# Patient Record
Sex: Female | Born: 1980 | Race: Asian | Hispanic: No | Marital: Married | Smoking: Never smoker
Health system: Southern US, Community
[De-identification: ages and names within clinical notes are randomized; demographics above are authoritative.]

## PROBLEM LIST (undated history)

## (undated) ENCOUNTER — Inpatient Hospital Stay (HOSPITAL_COMMUNITY): Payer: Self-pay

## (undated) DIAGNOSIS — N96 Recurrent pregnancy loss: Secondary | ICD-10-CM

## (undated) DIAGNOSIS — Z8619 Personal history of other infectious and parasitic diseases: Secondary | ICD-10-CM

## (undated) DIAGNOSIS — K759 Inflammatory liver disease, unspecified: Secondary | ICD-10-CM

## (undated) DIAGNOSIS — Z789 Other specified health status: Secondary | ICD-10-CM

## (undated) DIAGNOSIS — O09299 Supervision of pregnancy with other poor reproductive or obstetric history, unspecified trimester: Secondary | ICD-10-CM

## (undated) DIAGNOSIS — B1910 Unspecified viral hepatitis B without hepatic coma: Secondary | ICD-10-CM

## (undated) HISTORY — DX: Recurrent pregnancy loss: N96

## (undated) HISTORY — PX: DILATION AND CURETTAGE OF UTERUS: SHX78

## (undated) HISTORY — DX: Inflammatory liver disease, unspecified: K75.9

## (undated) HISTORY — DX: Personal history of other infectious and parasitic diseases: Z86.19

---

## 2012-03-11 ENCOUNTER — Other Ambulatory Visit: Payer: Self-pay

## 2012-03-12 LAB — OB RESULTS CONSOLE GC/CHLAMYDIA
Chlamydia: NEGATIVE
Gonorrhea: NEGATIVE

## 2012-03-12 LAB — OB RESULTS CONSOLE RUBELLA ANTIBODY, IGM: Rubella: IMMUNE

## 2012-03-12 LAB — OB RESULTS CONSOLE ANTIBODY SCREEN: Antibody Screen: NEGATIVE

## 2012-03-12 LAB — OB RESULTS CONSOLE RPR: RPR: NONREACTIVE

## 2012-04-02 ENCOUNTER — Ambulatory Visit (HOSPITAL_COMMUNITY)
Admission: RE | Admit: 2012-04-02 | Discharge: 2012-04-02 | Disposition: A | Discharge status: Home / Self Care | Payer: BC Managed Care – PPO | Source: Ambulatory Visit | Attending: Obstetrics and Gynecology | Admitting: Obstetrics and Gynecology

## 2012-04-02 DIAGNOSIS — B1910 Unspecified viral hepatitis B without hepatic coma: Secondary | ICD-10-CM | POA: Insufficient documentation

## 2012-04-02 DIAGNOSIS — O98519 Other viral diseases complicating pregnancy, unspecified trimester: Secondary | ICD-10-CM | POA: Insufficient documentation

## 2012-04-02 NOTE — Progress Notes (Signed)
Patient : Jennifer Adams; 31 y.o. MRN# [id number] [facility] Attending: Levi Aland, MD Consult Date: 04/02/2012 -  Consult for: Hepatitis B in pregnancy  HPI: Jennifer Adams is a 31 y.o. with a singleton IUP at 39 6/[redacted] weeks gestational age in pregnancy complicated by notation of Hepatitis B surface Ag positive status.  Subsequent testing demonstrated an chronic state of Hepatitis B infection with viral load in excess of 12,000 copies.  LFTs demonstrate mildly elevated transaminitis.  Hepatitis C titers were negative.  She was seen and evaluated by Hepatology/GI medicine by the [facility] medical group in [place].  She was prescribed Viread 300mg  po daily to reduce her viral load and in theory reduce chances of vertical transmission.  Allergy: Review of patient's allergies indicates not on file. Patient denies food, latex or environmental allergies.   Current Medications: 1. PNV po daily  2. Viread 300mg  po daily (although patient has yet to fill this prescription)  Past Medical History: No past medical history on file. Patient reports that she was first found to have Hepatitis B infection at the age of 31 in [place] prior to enrolling in college.  She never required admission to the hospital or medical treatment for the infection.  Past Surgical History: No past surgical history on file.   Past OB/GYN History: G1.  Elective termination of pregnancy by D&C following diagnosis of lethal aneuploidy via CVS G2.  Current pregnancy.  Social History:   Denies IVDA or recreation drug/alcohol use.   She states the only medication is prenatal vitamin from [facility].    Family History: noncontributory  Review of Systems: No N/V/D, fevers or chills.  Physical Exam: Gen:  Well-appearing and well-nourished Asian female HEENT:  NCAT.  No icterus Integument:  No jaundice Abd:  Gravid Extr:  No edema grossly Neuro:  Grossly intact  Impressions:  Jennifer Adams is a  31 y.o. G2P0010 with IUP at 68 6/[redacted] weeks GA with chronic carrier state of Hepatitis B infection.  Summary of Recommendations: 1.  Viread 300mg  po daily as recommended by her GI medicine specialist is a Class B medication in pregnancy and is in my opinion safe for her to take.  This medication may in theory reduce chance of vertical transmission to the fetus but is not a widespread practice. 2. I recommend HBIG in addition to Hepatitis B vaccination of the newborn within 12 hours of delivery; ie, the pediatricians should be notified of Hepatitis B chronic infection of this patient.  This is the classical method to reduce vertical transmission and administration must be ensured for neonatal benefit. 3. Breast-feeding is safe. 4. The patient should follow up with internal medicine/GI medicine specialist in the postpartum period.  I would recommend referring her to a specialist antepartum/now to establish a rapport and have established follow up of LFTs.  Otherwise, her LFTs should be followed monthly during the pregnancy until delivery.  Acute rise in LFTs should prompt immediate consultation with GI medicine/Hepatology. 5. Of note, there is increased risk of cholestasis of pregnancy with chronic hepatitis.  If the patient has onset of pruritis, I would recommend empiric treatment with Ursodiol 500 mg po bid while awaiting results of serum bile acids.  Of course, if this diagnosis is made, she would require interval growth ultrasounds, twice weekly antenatal testing and delivery by [redacted] weeks gestational age.  At this point, she has no such diagnosis or evidence to suggest drawing bile acids would be of use in  the absence of maternal symptoms. 6. Assuming the patient's status does not change and there remains no indication to time delivery (eg, postdates pregnancy, preeclampsia, etc), spontaneous labor may be allowed to occur and is recommended.  I spent in excess of 40 minutes in review of medical records,  evaluation, and education of your patient in consultation.  More than 50% of this time was spent in direct face-to-face counseling.  It was a pleasure seeing your patient today in consultation.  Thank you for allowing [place] the opportunity to contribute to the care of your patient.  Page with questions.  Merideth Abbey, MD, MS, FACOG Assistant Professor, Maternal-Fetal Medicine

## 2012-04-04 NOTE — Addendum Note (Signed)
Encounter addended by: Ty Hilts, RN on: 04/04/2012 10:22 AM<BR>     Documentation filed: Charges VN

## 2012-04-10 ENCOUNTER — Other Ambulatory Visit: Payer: Self-pay | Admitting: Obstetrics and Gynecology

## 2012-05-15 NOTE — L&D Delivery Note (Signed)
Patient was C/C/+1 and pushed for just over 60 minutes with epidural.   NSVD female infant, Apgars 9/9, weight 8#3.   The patient had one small laceration of the Left vaginal wall at introitus, mucosal, repaired with 3-0 vicryl. Fundus was firm. EBL was expected. Placenta was delivered intact. Vagina was clear.  Baby was vigorous to bedside.  Jennifer Adams

## 2012-05-26 ENCOUNTER — Encounter (HOSPITAL_COMMUNITY): Payer: Self-pay | Admitting: *Deleted

## 2012-05-26 ENCOUNTER — Inpatient Hospital Stay (HOSPITAL_COMMUNITY)
Admission: AD | Admit: 2012-05-26 | Discharge: 2012-05-26 | Disposition: A | Discharge status: Home / Self Care | Payer: BC Managed Care – PPO | Source: Ambulatory Visit | Attending: Obstetrics and Gynecology | Admitting: Obstetrics and Gynecology

## 2012-05-26 DIAGNOSIS — O99891 Other specified diseases and conditions complicating pregnancy: Secondary | ICD-10-CM | POA: Insufficient documentation

## 2012-05-26 DIAGNOSIS — R109 Unspecified abdominal pain: Secondary | ICD-10-CM | POA: Insufficient documentation

## 2012-05-26 HISTORY — DX: Other specified health status: Z78.9

## 2012-05-26 HISTORY — DX: Supervision of pregnancy with other poor reproductive or obstetric history, unspecified trimester: O09.299

## 2012-05-26 NOTE — MAU Note (Signed)
Jennifer Adams is a G1 at [redacted]w[redacted]d presents to MAU with ?ROM. Pt says she noticed some leaking last night that was mucousy and clear. Pt denies pain/contractions, she says she feels a cramp here and there.

## 2012-05-26 NOTE — MAU Note (Signed)
Pt reports she had some leaking last night about 2am. None since. Having mild period like cramping. Good fetal movement reported.

## 2012-05-28 ENCOUNTER — Telehealth (HOSPITAL_COMMUNITY): Payer: Self-pay | Admitting: *Deleted

## 2012-05-28 ENCOUNTER — Encounter (HOSPITAL_COMMUNITY): Payer: Self-pay | Admitting: *Deleted

## 2012-05-28 NOTE — Telephone Encounter (Signed)
Preadmission screen  

## 2012-05-29 ENCOUNTER — Inpatient Hospital Stay (HOSPITAL_COMMUNITY): Payer: BC Managed Care – PPO | Admitting: Anesthesiology

## 2012-05-29 ENCOUNTER — Encounter (HOSPITAL_COMMUNITY): Payer: Self-pay | Admitting: Anesthesiology

## 2012-05-29 ENCOUNTER — Inpatient Hospital Stay (HOSPITAL_COMMUNITY)
Admission: AD | Admit: 2012-05-29 | Discharge: 2012-06-01 | DRG: 373 | Disposition: A | Discharge status: Home / Self Care | Payer: BC Managed Care – PPO | Source: Ambulatory Visit | Attending: Obstetrics and Gynecology | Admitting: Obstetrics and Gynecology

## 2012-05-29 DIAGNOSIS — Z22330 Carrier of Group B streptococcus: Secondary | ICD-10-CM

## 2012-05-29 DIAGNOSIS — B181 Chronic viral hepatitis B without delta-agent: Secondary | ICD-10-CM | POA: Diagnosis present

## 2012-05-29 DIAGNOSIS — O99892 Other specified diseases and conditions complicating childbirth: Secondary | ICD-10-CM | POA: Diagnosis present

## 2012-05-29 DIAGNOSIS — B1910 Unspecified viral hepatitis B without hepatic coma: Secondary | ICD-10-CM

## 2012-05-29 DIAGNOSIS — O26619 Liver and biliary tract disorders in pregnancy, unspecified trimester: Secondary | ICD-10-CM | POA: Diagnosis present

## 2012-05-29 LAB — COMPREHENSIVE METABOLIC PANEL
ALT: 19 U/L (ref 0–35)
AST: 24 U/L (ref 0–37)
Albumin: 3.1 g/dL — ABNORMAL LOW (ref 3.5–5.2)
Alkaline Phosphatase: 198 U/L — ABNORMAL HIGH (ref 39–117)
Calcium: 9.7 mg/dL (ref 8.4–10.5)
Potassium: 4.2 mEq/L (ref 3.5–5.1)
Sodium: 136 mEq/L (ref 135–145)
Total Protein: 6.8 g/dL (ref 6.0–8.3)

## 2012-05-29 LAB — CBC
HCT: 38 % (ref 36.0–46.0)
Hemoglobin: 12.7 g/dL (ref 12.0–15.0)
MCV: 89.6 fL (ref 78.0–100.0)
RDW: 13.6 % (ref 11.5–15.5)
WBC: 17.7 10*3/uL — ABNORMAL HIGH (ref 4.0–10.5)

## 2012-05-29 MED ORDER — ONDANSETRON HCL 4 MG/2ML IJ SOLN: 4.0000 mg | Freq: Four times a day (QID) | INTRAMUSCULAR | Status: DC | PRN

## 2012-05-29 MED ORDER — EPHEDRINE 5 MG/ML INJ: 10.0000 mg | INTRAVENOUS | Status: DC | PRN

## 2012-05-29 MED ORDER — IBUPROFEN 600 MG PO TABS: 600.0000 mg | ORAL_TABLET | Freq: Four times a day (QID) | ORAL | Status: DC | PRN

## 2012-05-29 MED ORDER — PENICILLIN G POTASSIUM 5000000 UNITS IJ SOLR
5.0000 10*6.[IU] | Freq: Once | INTRAVENOUS | Status: AC
  Administered 2012-05-29: 5 10*6.[IU] via INTRAVENOUS
  Filled 2012-05-29: qty 5

## 2012-05-29 MED ORDER — LACTATED RINGERS IV SOLN
500.0000 mL | Freq: Once | INTRAVENOUS | Status: AC
  Administered 2012-05-29: 1000 mL via INTRAVENOUS

## 2012-05-29 MED ORDER — DIPHENHYDRAMINE HCL 50 MG/ML IJ SOLN: 12.5000 mg | INTRAMUSCULAR | Status: DC | PRN

## 2012-05-29 MED ORDER — CITRIC ACID-SODIUM CITRATE 334-500 MG/5ML PO SOLN
30.0000 mL | ORAL | Status: DC | PRN
  Filled 2012-05-29: qty 15

## 2012-05-29 MED ORDER — OXYTOCIN BOLUS FROM INFUSION: 500.0000 mL | INTRAVENOUS | Status: DC

## 2012-05-29 MED ORDER — TERBUTALINE SULFATE 1 MG/ML IJ SOLN: 0.2500 mg | Freq: Once | INTRAMUSCULAR | Status: AC | PRN

## 2012-05-29 MED ORDER — OXYTOCIN 40 UNITS IN LACTATED RINGERS INFUSION - SIMPLE MED: 62.5000 mL/h | INTRAVENOUS | Status: DC

## 2012-05-29 MED ORDER — LIDOCAINE HCL (PF) 1 % IJ SOLN
INTRAMUSCULAR | Status: DC | PRN
  Administered 2012-05-29 (×2): 4 mL

## 2012-05-29 MED ORDER — LACTATED RINGERS IV SOLN: 500.0000 mL | INTRAVENOUS | Status: DC | PRN

## 2012-05-29 MED ORDER — OXYTOCIN 40 UNITS IN LACTATED RINGERS INFUSION - SIMPLE MED
1.0000 m[IU]/min | INTRAVENOUS | Status: DC
  Administered 2012-05-29: 2 m[IU]/min via INTRAVENOUS
  Filled 2012-05-29: qty 1000

## 2012-05-29 MED ORDER — OXYCODONE-ACETAMINOPHEN 5-325 MG PO TABS: 1.0000 | ORAL_TABLET | ORAL | Status: DC | PRN

## 2012-05-29 MED ORDER — LACTATED RINGERS IV SOLN
INTRAVENOUS | Status: DC
  Administered 2012-05-29: 20:00:00 via INTRAVENOUS
  Administered 2012-05-29: 125 mL/h via INTRAVENOUS

## 2012-05-29 MED ORDER — FENTANYL 2.5 MCG/ML BUPIVACAINE 1/10 % EPIDURAL INFUSION (WH - ANES)
INTRAMUSCULAR | Status: DC | PRN
  Administered 2012-05-29: 13 mL/h via EPIDURAL

## 2012-05-29 MED ORDER — FENTANYL 2.5 MCG/ML BUPIVACAINE 1/10 % EPIDURAL INFUSION (WH - ANES)
14.0000 mL/h | INTRAMUSCULAR | Status: DC
  Filled 2012-05-29: qty 125

## 2012-05-29 MED ORDER — EPHEDRINE 5 MG/ML INJ
10.0000 mg | INTRAVENOUS | Status: DC | PRN
  Filled 2012-05-29: qty 4

## 2012-05-29 MED ORDER — ACETAMINOPHEN 325 MG PO TABS: 650.0000 mg | ORAL_TABLET | ORAL | Status: DC | PRN

## 2012-05-29 MED ORDER — LIDOCAINE HCL (PF) 1 % IJ SOLN
30.0000 mL | INTRAMUSCULAR | Status: DC | PRN
  Filled 2012-05-29: qty 30

## 2012-05-29 MED ORDER — PENICILLIN G POTASSIUM 5000000 UNITS IJ SOLR
2.5000 10*6.[IU] | INTRAVENOUS | Status: DC
  Administered 2012-05-29 – 2012-05-30 (×2): 2.5 10*6.[IU] via INTRAVENOUS
  Filled 2012-05-29 (×6): qty 2.5

## 2012-05-29 MED ORDER — FLEET ENEMA 7-19 GM/118ML RE ENEM: 1.0000 | ENEMA | RECTAL | Status: DC | PRN

## 2012-05-29 MED ORDER — PHENYLEPHRINE 40 MCG/ML (10ML) SYRINGE FOR IV PUSH (FOR BLOOD PRESSURE SUPPORT): 80.0000 ug | PREFILLED_SYRINGE | INTRAVENOUS | Status: DC | PRN

## 2012-05-29 MED ORDER — PHENYLEPHRINE 40 MCG/ML (10ML) SYRINGE FOR IV PUSH (FOR BLOOD PRESSURE SUPPORT)
80.0000 ug | PREFILLED_SYRINGE | INTRAVENOUS | Status: DC | PRN
  Filled 2012-05-29: qty 5

## 2012-05-29 NOTE — H&P (Addendum)
31 y.o. [redacted]w[redacted]d  G2P0010 comes in c/o SROM at 1445 .  Otherwise has good fetal movement and no bleeding.  Past Medical History  Diagnosis Date  . No pertinent past medical history   . 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 H/O varicella   . Hepatitis     hepatitis B    Past Surgical History  Procedure Date  . Dilation and curettage of uterus     OB History    Grav Para Term Preterm Abortions TAB SAB Ect Mult Living   2 0 0 0 1 1 0 0 0 0      # Outc Date GA Lbr Len/2nd Wgt Sex Del Anes PTL Lv   1 TAB 7/12 [redacted]w[redacted]d       No   Comments: Termination due to fetal anomaly's, ? Trisomy 18   2 CUR               History   Social History  . Marital Status: Married    Spouse Name: N/A    Number of Children: N/A  . Years of Education: N/A   Occupational History  . Not on file.   Social History Main Topics  . Smoking status: Never Smoker   . Smokeless tobacco: Never Used  . Alcohol Use: No  . Drug Use: No  . Sexually Active: Not Currently   Other Topics Concern  . Not on file   Social History Narrative  . No narrative on file   Review of patient's allergies indicates no known allergies.    Prenatal Transfer Tool  Maternal Diabetes: No Genetic Screening: Normal Maternal Ultrasounds/Referrals: Normal Fetal Ultrasounds or other Referrals:  None Maternal Substance Abuse:  No Significant Maternal Medications:  None Significant Maternal Lab Results: Lab values include: HBsAG positive  Other PNC:    Filed Vitals:   05/29/12 1505  BP: 127/78  Pulse: 89  Temp: 97.8 F (36.6 C)  Resp: 18     Lungs/Cor:  NAD Abdomen:  soft, gravid Ex:  no cords, erythema SVE:  3-4/C/-2 FHTs:  140s, good STV, NST R- class 1 Toco:  q2-5   A/P   SROM at 41 weeks.  Start pitocin if no change in 1 hour.  GBS pos.  Pt is Hep B +- unable to afford meds during pregnancy.   HORVATH,MICHELLE A   Chronic Hep B - declined Viread to reduce vertical transmission due to  expense. ALT/AST 41/33 (04/10/2012) MFM recommends HBIG and Hep B for baby within 12 hrs of delivery

## 2012-05-29 NOTE — Anesthesia Procedure Notes (Signed)
Epidural Patient location during procedure: OB Start time: 05/29/2012 8:01 PM  Staffing Anesthesiologist: Zyairah Wacha A. Performed by: anesthesiologist   Preanesthetic Checklist Completed: patient identified, site marked, surgical consent, pre-op evaluation, timeout performed, IV checked, risks and benefits discussed and monitors and equipment checked  Epidural Patient position: sitting Prep: site prepped and draped and DuraPrep Patient monitoring: continuous pulse ox and blood pressure Approach: midline Injection technique: LOR air  Needle:  Needle type: Tuohy  Needle gauge: 17 G Needle length: 9 cm and 9 Needle insertion depth: 4 cm Catheter type: closed end flexible Catheter size: 19 Gauge Catheter at skin depth: 9 cm Test dose: negative and Other  Assessment Events: blood not aspirated, injection not painful, no injection resistance, negative IV test and no paresthesia  Additional Notes Patient identified. Risks and benefits discussed including failed block, incomplete  Pain control, post dural puncture headache, nerve damage, paralysis, blood pressure Changes, nausea, vomiting, reactions to medications-both toxic and allergic and post Partum back pain. All questions were answered. Patient expressed understanding and wished to proceed. Sterile technique was used throughout procedure. Epidural site was Dressed with sterile barrier dressing. No paresthesias, signs of intravascular injection Or signs of intrathecal spread were encountered.  Patient was more comfortable after the epidural was dosed. Please see RN's note for documentation of vital signs and FHR which are stable.

## 2012-05-29 NOTE — MAU Note (Signed)
41 wks, SROM in office, was to be direct admit, L&D not aware will call Dr. Dareen Piano for orders

## 2012-05-29 NOTE — MAU Note (Signed)
Dr Dareen Piano called for orders, he had just completed call to L&D, pt taken to Room 166 via W/C

## 2012-05-29 NOTE — Anesthesia Preprocedure Evaluation (Signed)
Anesthesia Evaluation  Patient identified by MRN, date of birth, ID band Patient awake    Reviewed: Allergy & Precautions, H&P , Patient's Chart, lab work & pertinent test results  Airway Mallampati: III TM Distance: >3 FB Neck ROM: full    Dental No notable dental hx. (+) Teeth Intact   Pulmonary neg pulmonary ROS,  breath sounds clear to auscultation  Pulmonary exam normal       Cardiovascular negative cardio ROS  Rhythm:regular Rate:Normal     Neuro/Psych negative neurological ROS  negative psych ROS   GI/Hepatic negative GI ROS, (+) Hepatitis -, B  Endo/Other  negative endocrine ROS  Renal/GU negative Renal ROS  negative genitourinary   Musculoskeletal   Abdominal Normal abdominal exam  (+)   Peds  Hematology negative hematology ROS (+)   Anesthesia Other Findings   Reproductive/Obstetrics (+) Pregnancy                           Anesthesia Physical Anesthesia Plan  ASA: II  Anesthesia Plan: Epidural   Post-op Pain Management:    Induction:   Airway Management Planned:   Additional Equipment:   Intra-op Plan:   Post-operative Plan:   Informed Consent: I have reviewed the patients History and Physical, chart, labs and discussed the procedure including the risks, benefits and alternatives for the proposed anesthesia with the patient or authorized representative who has indicated his/her understanding and acceptance.     Plan Discussed with: Anesthesiologist  Anesthesia Plan Comments:         Anesthesia Quick Evaluation

## 2012-05-30 ENCOUNTER — Inpatient Hospital Stay (HOSPITAL_COMMUNITY): Admission: RE | Admit: 2012-05-30 | Payer: BC Managed Care – PPO | Source: Ambulatory Visit

## 2012-05-30 ENCOUNTER — Encounter (HOSPITAL_COMMUNITY): Payer: Self-pay | Admitting: *Deleted

## 2012-05-30 LAB — CBC
MCHC: 33.8 g/dL (ref 30.0–36.0)
RDW: 13.6 % (ref 11.5–15.5)
WBC: 21.2 10*3/uL — ABNORMAL HIGH (ref 4.0–10.5)

## 2012-05-30 MED ORDER — DIBUCAINE 1 % RE OINT: 1.0000 "application " | TOPICAL_OINTMENT | RECTAL | Status: DC | PRN

## 2012-05-30 MED ORDER — WITCH HAZEL-GLYCERIN EX PADS: 1.0000 "application " | MEDICATED_PAD | CUTANEOUS | Status: DC | PRN

## 2012-05-30 MED ORDER — PRENATAL MULTIVITAMIN CH
1.0000 | ORAL_TABLET | Freq: Every day | ORAL | Status: DC
  Administered 2012-05-30 – 2012-06-01 (×3): 1 via ORAL
  Filled 2012-05-30 (×3): qty 1

## 2012-05-30 MED ORDER — TETANUS-DIPHTH-ACELL PERTUSSIS 5-2.5-18.5 LF-MCG/0.5 IM SUSP: 0.5000 mL | Freq: Once | INTRAMUSCULAR | Status: DC

## 2012-05-30 MED ORDER — SENNOSIDES-DOCUSATE SODIUM 8.6-50 MG PO TABS
2.0000 | ORAL_TABLET | Freq: Every day | ORAL | Status: DC
  Administered 2012-05-30 – 2012-05-31 (×2): 2 via ORAL

## 2012-05-30 MED ORDER — ONDANSETRON HCL 4 MG/2ML IJ SOLN: 4.0000 mg | INTRAMUSCULAR | Status: DC | PRN

## 2012-05-30 MED ORDER — SIMETHICONE 80 MG PO CHEW: 80.0000 mg | CHEWABLE_TABLET | ORAL | Status: DC | PRN

## 2012-05-30 MED ORDER — DIPHENHYDRAMINE HCL 25 MG PO CAPS: 25.0000 mg | ORAL_CAPSULE | Freq: Four times a day (QID) | ORAL | Status: DC | PRN

## 2012-05-30 MED ORDER — ZOLPIDEM TARTRATE 5 MG PO TABS: 5.0000 mg | ORAL_TABLET | Freq: Every evening | ORAL | Status: DC | PRN

## 2012-05-30 MED ORDER — LANOLIN HYDROUS EX OINT: TOPICAL_OINTMENT | CUTANEOUS | Status: DC | PRN

## 2012-05-30 MED ORDER — ONDANSETRON HCL 4 MG PO TABS: 4.0000 mg | ORAL_TABLET | ORAL | Status: DC | PRN

## 2012-05-30 MED ORDER — OXYCODONE-ACETAMINOPHEN 5-325 MG PO TABS
1.0000 | ORAL_TABLET | ORAL | Status: DC | PRN
  Administered 2012-05-30 – 2012-05-31 (×2): 1 via ORAL
  Filled 2012-05-30 (×2): qty 1

## 2012-05-30 MED ORDER — IBUPROFEN 600 MG PO TABS
600.0000 mg | ORAL_TABLET | Freq: Four times a day (QID) | ORAL | Status: DC
  Administered 2012-05-30 – 2012-06-01 (×10): 600 mg via ORAL
  Filled 2012-05-30 (×10): qty 1

## 2012-05-30 MED ORDER — BENZOCAINE-MENTHOL 20-0.5 % EX AERO: 1.0000 "application " | INHALATION_SPRAY | CUTANEOUS | Status: DC | PRN

## 2012-05-30 NOTE — Anesthesia Postprocedure Evaluation (Signed)
  Anesthesia Post-op Note  Patient: Jennifer Adams  Procedure(s) Performed: * No procedures listed *  Patient Location: Mother/Baby  Anesthesia Type:Epidural  Level of Consciousness: awake  Airway and Oxygen Therapy: Patient Spontanous Breathing  Post-op Pain: none  Post-op Assessment: Patient's Cardiovascular Status Stable, Respiratory Function Stable, Patent Airway, No signs of Nausea or vomiting, Adequate PO intake, Pain level controlled, No headache, No backache, No residual numbness and No residual motor weakness  Post-op Vital Signs: Reviewed and stable  Complications: No apparent anesthesia complications

## 2012-05-30 NOTE — Progress Notes (Signed)
I spoke with nursery personally to confirm awareness of mother's chronic Hep B status and need for Hep B vaccine as well as HBIG within 12 hrs of delivery.

## 2012-05-30 NOTE — Progress Notes (Signed)
Patient is eating, ambulating, voiding.  Pain control is good.  Appropriate lochia.  No complaints.  Filed Vitals:   05/30/12 0316 05/30/12 0331 05/30/12 0415 05/30/12 0530  BP: 121/67 122/63 125/80 113/74  Pulse: 78 75 76 80  Temp:   98 F (36.7 C) 98.2 F (36.8 C)  TempSrc:   Oral Oral  Resp:   16 16  Height:      Weight:      SpO2:   98% 96%    Fundus firm Perineum without swelling. No CT  Lab Results  Component Value Date   WBC 17.7* 05/29/2012   HGB 12.7 05/29/2012   HCT 38.0 05/29/2012   MCV 89.6 05/29/2012   PLT 272 05/29/2012    --/--/O POS, O POS (01/15 1600)  A/P Post partum day 1. No circ. Baby received HBIG and Hep B vaccine.  Routine care.  Expect d/c 1/17.    Philip Aspen

## 2012-05-31 NOTE — Progress Notes (Signed)
PPD#1 Pt without complaints. Does not want to go home today. Baby is doing well. VSSAF IMP/ Doing well. PLAN/ will discharge in am.

## 2012-06-01 MED ORDER — OXYCODONE-ACETAMINOPHEN 5-325 MG PO TABS: 1.0000 | ORAL_TABLET | ORAL | Status: DC | PRN

## 2012-06-01 NOTE — Discharge Summary (Signed)
Obstetric Discharge Summary Reason for Admission: rupture of membranes Prenatal Procedures: ultrasound Intrapartum Procedures: spontaneous vaginal delivery Postpartum Procedures: none Complications-Operative and Postpartum: chronic hepatitis B Hemoglobin  Date Value Range Status  05/30/2012 11.2* 12.0 - 15.0 g/dL Final     HCT  Date Value Range Status  05/30/2012 33.1* 36.0 - 46.0 % Final    Physical Exam:  General: alert Lochia: appropriate Uterine Fundus: firm Discharge Diagnoses: Term Pregnancy-delivered and Chronic Hep B  Discharge Information: Date: 06/01/2012 Activity: pelvic rest Diet: routine Medications: PNV, Ibuprofen and Percocet Condition: stable Instructions: refer to practice specific booklet Discharge to: home Follow-up Information    Follow up with CALLAHAN, SIDNEY, DO. Schedule an appointment as soon as possible for a visit in 4 weeks.   Contact information:   [address] [place] [address] [phone]          Newborn Data: Live born female  Birth Weight: 8 lb 3.2 oz (3719 g) APGAR: 9, 9  Home with mother.  ANDERSON,MARK E 06/01/2012, 10:31 AM

## 2012-06-01 NOTE — Progress Notes (Signed)
PPD#2 Pt without complaints. VSSAF IMP/ ready for discharge PLAN/ discharge.

## 2012-06-02 LAB — TYPE AND SCREEN
ABO/RH(D): O POS
Antibody Screen: NEGATIVE
Unit division: 0
Unit division: 0

## 2012-06-29 ENCOUNTER — Other Ambulatory Visit: Payer: Self-pay

## 2013-03-20 ENCOUNTER — Other Ambulatory Visit: Payer: Self-pay

## 2013-10-31 ENCOUNTER — Encounter (HOSPITAL_COMMUNITY): Payer: Self-pay | Admitting: General Practice

## 2013-10-31 ENCOUNTER — Inpatient Hospital Stay (HOSPITAL_COMMUNITY)
Admission: AD | Admit: 2013-10-31 | Discharge: 2013-10-31 | Disposition: A | Discharge status: Home / Self Care | Payer: Managed Care, Other (non HMO) | Source: Ambulatory Visit | Attending: Obstetrics and Gynecology | Admitting: Obstetrics and Gynecology

## 2013-10-31 ENCOUNTER — Inpatient Hospital Stay (HOSPITAL_COMMUNITY): Payer: Managed Care, Other (non HMO)

## 2013-10-31 DIAGNOSIS — O200 Threatened abortion: Secondary | ICD-10-CM | POA: Insufficient documentation

## 2013-10-31 HISTORY — DX: Unspecified viral hepatitis B without hepatic coma: B19.10

## 2013-10-31 LAB — URINALYSIS, ROUTINE W REFLEX MICROSCOPIC
BILIRUBIN URINE: NEGATIVE
Glucose, UA: NEGATIVE mg/dL
Ketones, ur: NEGATIVE mg/dL
Leukocytes, UA: NEGATIVE
Nitrite: NEGATIVE
PROTEIN: NEGATIVE mg/dL
Specific Gravity, Urine: <1.005 — ABNORMAL LOW (ref 1.005–1.030)
UROBILINOGEN UA: 0.2 mg/dL (ref 0.0–1.0)
pH: 6 (ref 5.0–8.0)

## 2013-10-31 LAB — CBC
HEMATOCRIT: 38.6 % (ref 36.0–46.0)
Hemoglobin: 13.4 g/dL (ref 12.0–15.0)
MCH: 29.8 pg (ref 26.0–34.0)
MCHC: 34.7 g/dL (ref 30.0–36.0)
MCV: 86 fL (ref 78.0–100.0)
Platelets: 323 10*3/uL (ref 150–400)
RBC: 4.49 MIL/uL (ref 3.87–5.11)
RDW: 11.9 % (ref 11.5–15.5)
WBC: 5.8 10*3/uL (ref 4.0–10.5)

## 2013-10-31 LAB — URINE MICROSCOPIC-ADD ON

## 2013-10-31 LAB — POCT PREGNANCY, URINE: Preg Test, Ur: POSITIVE — AB

## 2013-10-31 LAB — WET PREP, GENITAL
Clue Cells Wet Prep HPF POC: NONE SEEN
Trich, Wet Prep: NONE SEEN
Yeast Wet Prep HPF POC: NONE SEEN

## 2013-10-31 LAB — HCG, QUANTITATIVE, PREGNANCY: hCG, Beta Chain, Quant, S: 141 m[IU]/mL — ABNORMAL HIGH (ref <5)

## 2013-10-31 NOTE — MAU Note (Signed)
Patient states she had a positive home pregnancy test about 2 weeks ago. States she had light spotting yesterday, more today. Denies pain, nausea or vomiting.

## 2013-10-31 NOTE — Discharge Instructions (Signed)

## 2013-10-31 NOTE — MAU Provider Note (Signed)
Chief Complaint: Possible Pregnancy and Vaginal Bleeding   First Malaquias Lenker Initiated Contact with Patient 10/31/13 1148     SUBJECTIVE HPI: Jennifer Adams is a 33 y.o. G3P1011 at 2818w6d by LMP who presents to maternity admissions reporting positive pregnancy test at home and onset of spotting yesterday with increase to bright red bleeding today.  She reports 1/4 of each pad soaked when changed 2-3 times today.  She denies vaginal itching/burning, urinary symptoms, h/a, dizziness, n/v, or fever/chills.     Past Medical History  Diagnosis Date  . No pertinent past medical history   . 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 H/O varicella   . Hepatitis     hepatitis B  . Hepatitis B    Past Surgical History  Procedure Laterality Date  . Dilation and curettage of uterus     History   Social History  . Marital Status: Married    Spouse Name: N/A    Number of Children: N/A  . Years of Education: N/A   Occupational History  . Not on file.   Social History Main Topics  . Smoking status: Never Smoker   . Smokeless tobacco: Never Used  . Alcohol Use: No  . Drug Use: No  . Sexual Activity: Not Currently   Other Topics Concern  . Not on file   Social History Narrative  . No narrative on file   No current facility-administered medications on file prior to encounter.   No current outpatient prescriptions on file prior to encounter.   No Known Allergies  ROS: Pertinent items in HPI  OBJECTIVE Blood pressure 125/76, pulse 76, temperature 99.2 F (37.3 C), temperature source Oral, resp. rate 16, height 5\' 3"  (1.6 m), weight 66.588 kg (146 lb 12.8 oz), last menstrual period 09/13/2013, unknown if currently breastfeeding. GENERAL: Well-developed, well-nourished female in no acute distress.  HEENT: Normocephalic HEART: normal rate RESP: normal effort ABDOMEN: Soft, non-tender EXTREMITIES: Nontender, no edema NEURO: Alert and oriented Pelvic exam: Cervix pink,  visually closed, without lesion, large amount of dark red blood without clots, vaginal walls and external genitalia normal Bimanual exam: Cervix 0/long/high, firm, anterior, neg CMT, uterus nontender, nonenlarged, adnexa without tenderness, enlargement, or mass  LAB RESULTS Results for orders placed during the hospital encounter of 10/31/13 (from the past 24 hour(s))  URINALYSIS, ROUTINE W REFLEX MICROSCOPIC     Status: Abnormal   Collection Time    10/31/13 11:01 AM      Result Value Ref Range   Color, Urine YELLOW  YELLOW   APPearance HAZY (*) CLEAR   Specific Gravity, Urine <1.005 (*) 1.005 - 1.030   pH 6.0  5.0 - 8.0   Glucose, UA NEGATIVE  NEGATIVE mg/dL   Hgb urine dipstick LARGE (*) NEGATIVE   Bilirubin Urine NEGATIVE  NEGATIVE   Ketones, ur NEGATIVE  NEGATIVE mg/dL   Protein, ur NEGATIVE  NEGATIVE mg/dL   Urobilinogen, UA 0.2  0.0 - 1.0 mg/dL   Nitrite NEGATIVE  NEGATIVE   Leukocytes, UA NEGATIVE  NEGATIVE  URINE MICROSCOPIC-ADD ON     Status: Abnormal   Collection Time    10/31/13 11:01 AM      Result Value Ref Range   Squamous Epithelial / LPF FEW (*) RARE   RBC / HPF 0-2  <3 RBC/hpf   Bacteria, UA FEW (*) RARE  POCT PREGNANCY, URINE     Status: Abnormal   Collection Time    10/31/13 11:06 AM  Result Value Ref Range   Preg Test, Ur POSITIVE (*) NEGATIVE  WET PREP, GENITAL     Status: Abnormal   Collection Time    10/31/13 11:57 AM      Result Value Ref Range   Yeast Wet Prep HPF POC NONE SEEN  NONE SEEN   Trich, Wet Prep NONE SEEN  NONE SEEN   Clue Cells Wet Prep HPF POC NONE SEEN  NONE SEEN   WBC, Wet Prep HPF POC MODERATE (*) NONE SEEN  CBC     Status: None   Collection Time    10/31/13 12:00 PM      Result Value Ref Range   WBC 5.8  4.0 - 10.5 K/uL   RBC 4.49  3.87 - 5.11 MIL/uL   Hemoglobin 13.4  12.0 - 15.0 g/dL   HCT 16.1  09.6 - 04.5 %   MCV 86.0  78.0 - 100.0 fL   MCH 29.8  26.0 - 34.0 pg   MCHC 34.7  30.0 - 36.0 g/dL   RDW 40.9  81.1 - 91.4 %    Platelets 323  150 - 400 K/uL  HCG, QUANTITATIVE, PREGNANCY     Status: Abnormal   Collection Time    10/31/13 12:00 PM      Result Value Ref Range   hCG, Beta Chain, Quant, S 141 (*) <5 mIU/mL    IMAGING US Ob Comp Less 14 Wks  10/31/2013   CLINICAL DATA:  Vaginal bleeding.  First trimester pregnancy.  EXAM: OBSTETRIC <14 WK [place] AND TRANSVAGINAL OB US  TECHNIQUE: Both transabdominal and transvaginal ultrasound examinations were performed for complete evaluation of the gestation as well as the maternal uterus, adnexal regions, and pelvic cul-de-sac. Transvaginal technique was performed to assess early pregnancy.  COMPARISON:  None.  FINDINGS: Intrauterine gestational sac: Visualized but small.  Yolk sac:  Not visualized  Embryo:  Not visualized  Cardiac Activity: Not visualized  MSD:  3  mm   4 w   5  d  [place] EDC: 07/05/2014  Maternal uterus/adnexae: 2 cm cyst, right ovary. Adnexa otherwise unremarkable. No free pelvic fluid.  IMPRESSION: 1. 3 mm fluid collection favoring gestational sac in the endometrial cavity. Probable early intrauterine gestational sac, but no yolk sac, fetal pole, or cardiac activity yet visualized. Recommend follow-up quantitative B-HCG levels and follow-up US in 14 days to confirm and assess viability. This recommendation follows SRU consensus guidelines: Diagnostic Criteria for Nonviable Pregnancy Early in the First Trimester. Malva Limes Med 2013; 782:9562-13.   Electronically Signed   By: Herbie Baltimore M.D.   On: 10/31/2013 13:00   US Ob Transvaginal  10/31/2013   CLINICAL DATA:  Vaginal bleeding.  First trimester pregnancy.  EXAM: OBSTETRIC <14 WK [place] AND TRANSVAGINAL OB US  TECHNIQUE: Both transabdominal and transvaginal ultrasound examinations were performed for complete evaluation of the gestation as well as the maternal uterus, adnexal regions, and pelvic cul-de-sac. Transvaginal technique was performed to assess early pregnancy.  COMPARISON:  None.  FINDINGS:  Intrauterine gestational sac: Visualized but small.  Yolk sac:  Not visualized  Embryo:  Not visualized  Cardiac Activity: Not visualized  MSD:  3  mm   4 w   5  d  [place] EDC: 07/05/2014  Maternal uterus/adnexae: 2 cm cyst, right ovary. Adnexa otherwise unremarkable. No free pelvic fluid.  IMPRESSION: 1. 3 mm fluid collection favoring gestational sac in the endometrial cavity. Probable early intrauterine gestational sac, but no yolk sac, fetal  pole, or cardiac activity yet visualized. Recommend follow-up quantitative B-HCG levels and follow-up US in 14 days to confirm and assess viability. This recommendation follows SRU consensus guidelines: Diagnostic Criteria for Nonviable Pregnancy Early in the First Trimester. Malva Limes Engl J Med 2013; 161:0960-45; 369:1443-51.   Electronically Signed   By: Herbie BaltimoreWalt  Liebkemann M.D.   On: 10/31/2013 13:00    ASSESSMENT 1. Threatened miscarriage in early pregnancy     PLAN Consult Dr Claiborne Billingsallahan Discharge home Return to MAU in 48 hours for repeat quant hcg Return sooner as needed for emergencies    Medication List         acetaminophen 325 MG tablet  Commonly known as:  TYLENOL  Take 650 mg by mouth every 6 (six) hours as needed for mild pain or headache.     loratadine 10 MG tablet  Commonly known as:  CLARITIN  Take 10 mg by mouth daily as needed for allergies.     prenatal multivitamin Tabs tablet  Take 1 tablet by mouth daily at 12 noon.           Follow-up Information   [facility] days. (Return to MAU on Sunday 6/21 after noon for repeat labs.  Return sooner as needed for emergencies. )    Contact information:   [address] [id number] [place] [id number] [phone]      Sharen CounterLisa Leftwich-Kirby Certified Nurse-Midwife 10/31/2013  1:31 PM

## 2013-11-01 LAB — GC/CHLAMYDIA PROBE AMP
CT PROBE, AMP APTIMA: NEGATIVE
GC PROBE AMP APTIMA: NEGATIVE

## 2013-11-02 ENCOUNTER — Inpatient Hospital Stay (HOSPITAL_COMMUNITY)
Admission: AD | Admit: 2013-11-02 | Discharge: 2013-11-02 | Disposition: A | Discharge status: Home / Self Care | Payer: Managed Care, Other (non HMO) | Source: Ambulatory Visit | Attending: Obstetrics and Gynecology | Admitting: Obstetrics and Gynecology

## 2013-11-02 DIAGNOSIS — O039 Complete or unspecified spontaneous abortion without complication: Secondary | ICD-10-CM

## 2013-11-02 LAB — HCG, QUANTITATIVE, PREGNANCY: hCG, Beta Chain, Quant, S: 20 m[IU]/mL — ABNORMAL HIGH (ref <5)

## 2013-11-02 NOTE — Discharge Instructions (Signed)
Miscarriage A miscarriage is the sudden loss of an unborn baby (fetus) before the 20th week of pregnancy. Most miscarriages happen in the first 3 months of pregnancy. Sometimes, it happens before a woman even knows she is pregnant. A miscarriage is also called a "spontaneous miscarriage" or "early pregnancy loss." Having a miscarriage can be an emotional experience. Talk with your caregiver about any questions you may have about miscarrying, the grieving process, and your future pregnancy plans. CAUSES   Problems with the fetal chromosomes that make it impossible for the baby to develop normally. Problems with the baby's genes or chromosomes are most often the result of errors that occur, by chance, as the embryo divides and grows. The problems are not inherited from the parents.  Infection of the cervix or uterus.   Hormone problems.   Problems with the cervix, such as having an incompetent cervix. This is when the tissue in the cervix is not strong enough to hold the pregnancy.   Problems with the uterus, such as an abnormally shaped uterus, uterine fibroids, or congenital abnormalities.   Certain medical conditions.   Smoking, drinking alcohol, or taking illegal drugs.   Trauma.  Often, the cause of a miscarriage is unknown.  SYMPTOMS   Vaginal bleeding or spotting, with or without cramps or pain.  Pain or cramping in the abdomen or lower back.  Passing fluid, tissue, or blood clots from the vagina. DIAGNOSIS  Your caregiver will perform a physical exam. You may also have an ultrasound to confirm the miscarriage. Blood or urine tests may also be ordered. TREATMENT   Sometimes, treatment is not necessary if you naturally pass all the fetal tissue that was in the uterus. If some of the fetus or placenta remains in the body (incomplete miscarriage), tissue left behind may become infected and must be removed. Usually, a dilation and curettage (D and C) procedure is performed.  During a D and C procedure, the cervix is widened (dilated) and any remaining fetal or placental tissue is gently removed from the uterus.  Antibiotic medicines are prescribed if there is an infection. Other medicines may be given to reduce the size of the uterus (contract) if there is a lot of bleeding.  If you have Rh negative blood and your baby was Rh positive, you will need a Rh immunoglobulin shot. This shot will protect any future baby from having Rh blood problems in future pregnancies. HOME CARE INSTRUCTIONS   Your caregiver may order bed rest or may allow you to continue light activity. Resume activity as directed by your caregiver.  Have someone help with home and family responsibilities during this time.   Keep track of the number of sanitary pads you use each day and how soaked (saturated) they are. Write down this information.   Do not use tampons. Do not douche or have sexual intercourse until approved by your caregiver.   Only take over-the-counter or prescription medicines for pain or discomfort as directed by your caregiver.   Do not take aspirin. Aspirin can cause bleeding.   Keep all follow-up appointments with your caregiver.   If you or your partner have problems with grieving, talk to your caregiver or seek counseling to help cope with the pregnancy loss. Allow enough time to grieve before trying to get pregnant again.  SEEK IMMEDIATE MEDICAL CARE IF:   You have severe cramps or pain in your back or abdomen.  You have a fever.  You pass large blood clots (walnut-sized   or larger) ortissue from your vagina. Save any tissue for your caregiver to inspect.   Your bleeding increases.   You have a thick, bad-smelling vaginal discharge.  You become lightheaded, weak, or you faint.   You have chills.  MAKE SURE YOU:  Understand these instructions.  Will watch your condition.  Will get help right away if you are not doing well or get  worse. Document Released: 10/25/2000 Document Revised: 08/26/2012 Document Reviewed: 06/20/2011 ExitCare Patient Information 2015 [employer]. This information is not intended to replace advice given to you by your health care provider. Make sure you discuss any questions you have with your health care provider.  

## 2013-11-02 NOTE — MAU Provider Note (Signed)
  History     CSN: [id number]  Arrival date and time: 11/02/13 1234   None     Chief Complaint  Patient presents with  . Labs Only   HPI  Jennifer Adams is a 33 y.o. G3P1011 at 7237w1d who presents today for FU HCG. She denies any pain at this time, and has had some scant bleeding.   Past Medical History  Diagnosis Date  . No pertinent past medical history   . 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 H/O varicella   . Hepatitis     hepatitis B  . Hepatitis B     Past Surgical History  Procedure Laterality Date  . Dilation and curettage of uterus      Family History  Problem Relation Age of Onset  . Hypertension Father   . Cancer Maternal Uncle     History  Substance Use Topics  . Smoking status: Never Smoker   . Smokeless tobacco: Never Used  . Alcohol Use: No    Allergies: No Known Allergies  Prescriptions prior to admission  Medication Sig Dispense Refill  . acetaminophen (TYLENOL) 325 MG tablet Take 650 mg by mouth every 6 (six) hours as needed for mild pain or headache.      . loratadine (CLARITIN) 10 MG tablet Take 10 mg by mouth daily as needed for allergies.      . Prenatal Vit-Fe Fumarate-FA (PRENATAL MULTIVITAMIN) TABS tablet Take 1 tablet by mouth daily at 12 noon.        ROS Physical Exam   Blood pressure 122/66, pulse 65, resp. rate 18, last menstrual period 09/13/2013, unknown if currently breastfeeding.  Physical Exam  Nursing note and vitals reviewed. Constitutional: She is oriented to person, place, and time. She appears well-developed and well-nourished. No distress.  Cardiovascular: Normal rate.   Respiratory: Effort normal.  GI: Soft. There is no tenderness.  Neurological: She is alert and oriented to person, place, and time.  Skin: Skin is warm and dry.  Psychiatric: She has a normal mood and affect.    MAU Course  Procedures  Results for Jennifer Adams, Jennifer Adams (MRN [id number]) as of 11/02/2013 13:19  Ref. Range 10/31/2013 12:00  10/31/2013 12:52 11/02/2013 12:43  hCG, Beta Chain, Quant, S Latest Range: <5 mIU/mL 141 (H)  20 (H)   Assessment and Plan   1. SAB (spontaneous abortion)    Bleeding precautions Return to MAU as needed FU with the office as planned   Tawnya CrookHogan, Heather Donovan 11/02/2013, 1:19 PM

## 2013-11-02 NOTE — MAU Note (Signed)
Pt presents to MAU for repeat BHCG. Denies any pain, reports scant amount of vaginal bleeding 

## 2014-03-06 LAB — OB RESULTS CONSOLE GC/CHLAMYDIA
Chlamydia: NEGATIVE
GC PROBE AMP, GENITAL: NEGATIVE

## 2014-03-16 ENCOUNTER — Encounter (HOSPITAL_COMMUNITY): Payer: Self-pay | Admitting: General Practice

## 2014-03-31 LAB — OB RESULTS CONSOLE ABO/RH: RH Type: POSITIVE

## 2014-03-31 LAB — OB RESULTS CONSOLE HIV ANTIBODY (ROUTINE TESTING): HIV: NONREACTIVE

## 2014-03-31 LAB — OB RESULTS CONSOLE RPR: RPR: NONREACTIVE

## 2014-03-31 LAB — OB RESULTS CONSOLE RUBELLA ANTIBODY, IGM: RUBELLA: IMMUNE

## 2014-03-31 LAB — OB RESULTS CONSOLE ANTIBODY SCREEN: ANTIBODY SCREEN: NEGATIVE

## 2014-05-15 NOTE — L&D Delivery Note (Signed)
Delivery Note At 1:17 AM a viable and healthy female was delivered via Vaginal, Spontaneous Delivery (Presentation: Right ; Occiput Anterior).  APGAR: 9, 9; weight  pending.   Placenta status: Intact, Spontaneous.  Cord: 3 vessels with the following complications: None.    Anesthesia: Epidural  Episiotomy: None Lacerations: None Suture Repair: None Est. Blood Loss (mL):  300cc estimated  Mom to postpartum.  Baby to Couplet care / Skin to Skin.  Gayleen Sholtz H. 10/22/2014, 1:40 AM

## 2014-08-28 LAB — OB RESULTS CONSOLE HEPATITIS B SURFACE ANTIGEN: Hepatitis B Surface Ag: POSITIVE

## 2014-09-05 ENCOUNTER — Encounter (HOSPITAL_COMMUNITY): Payer: Self-pay | Admitting: *Deleted

## 2014-09-18 LAB — OB RESULTS CONSOLE GBS: STREP GROUP B AG: NEGATIVE

## 2014-10-21 ENCOUNTER — Encounter (HOSPITAL_COMMUNITY): Payer: Self-pay | Admitting: *Deleted

## 2014-10-21 ENCOUNTER — Inpatient Hospital Stay (HOSPITAL_COMMUNITY): Payer: Managed Care, Other (non HMO) | Admitting: Anesthesiology

## 2014-10-21 ENCOUNTER — Inpatient Hospital Stay (HOSPITAL_COMMUNITY)
Admission: AD | Admit: 2014-10-21 | Discharge: 2014-10-23 | DRG: 774 | Disposition: A | Discharge status: Home / Self Care | Payer: Managed Care, Other (non HMO) | Source: Ambulatory Visit | Attending: Obstetrics and Gynecology | Admitting: Obstetrics and Gynecology

## 2014-10-21 DIAGNOSIS — O9842 Viral hepatitis complicating childbirth: Secondary | ICD-10-CM | POA: Diagnosis present

## 2014-10-21 DIAGNOSIS — O4292 Full-term premature rupture of membranes, unspecified as to length of time between rupture and onset of labor: Principal | ICD-10-CM | POA: Diagnosis present

## 2014-10-21 DIAGNOSIS — B181 Chronic viral hepatitis B without delta-agent: Secondary | ICD-10-CM | POA: Diagnosis present

## 2014-10-21 DIAGNOSIS — O480 Post-term pregnancy: Secondary | ICD-10-CM | POA: Diagnosis present

## 2014-10-21 DIAGNOSIS — Z3A40 40 weeks gestation of pregnancy: Secondary | ICD-10-CM | POA: Diagnosis present

## 2014-10-21 DIAGNOSIS — IMO0001 Reserved for inherently not codable concepts without codable children: Secondary | ICD-10-CM

## 2014-10-21 LAB — CBC
HCT: 34.4 % — ABNORMAL LOW (ref 36.0–46.0)
HEMOGLOBIN: 11.9 g/dL — AB (ref 12.0–15.0)
MCH: 30.4 pg (ref 26.0–34.0)
MCHC: 34.6 g/dL (ref 30.0–36.0)
MCV: 88 fL (ref 78.0–100.0)
PLATELETS: 195 10*3/uL (ref 150–400)
RBC: 3.91 MIL/uL (ref 3.87–5.11)
RDW: 12.9 % (ref 11.5–15.5)
WBC: 16.9 10*3/uL — ABNORMAL HIGH (ref 4.0–10.5)

## 2014-10-21 LAB — TYPE AND SCREEN
ABO/RH(D): O POS
ANTIBODY SCREEN: NEGATIVE

## 2014-10-21 MED ORDER — OXYCODONE-ACETAMINOPHEN 5-325 MG PO TABS: 1.0000 | ORAL_TABLET | ORAL | Status: DC | PRN

## 2014-10-21 MED ORDER — LIDOCAINE HCL (PF) 1 % IJ SOLN
INTRAMUSCULAR | Status: DC | PRN
  Administered 2014-10-21: 4 mL
  Administered 2014-10-21: 3 mL

## 2014-10-21 MED ORDER — OXYTOCIN 40 UNITS IN LACTATED RINGERS INFUSION - SIMPLE MED
62.5000 mL/h | INTRAVENOUS | Status: DC
  Filled 2014-10-21: qty 1000

## 2014-10-21 MED ORDER — ACETAMINOPHEN 325 MG PO TABS: 650.0000 mg | ORAL_TABLET | ORAL | Status: DC | PRN

## 2014-10-21 MED ORDER — ZOLPIDEM TARTRATE 5 MG PO TABS: 5.0000 mg | ORAL_TABLET | Freq: Every evening | ORAL | Status: DC | PRN

## 2014-10-21 MED ORDER — FENTANYL 2.5 MCG/ML BUPIVACAINE 1/10 % EPIDURAL INFUSION (WH - ANES)
14.0000 mL/h | INTRAMUSCULAR | Status: DC | PRN
  Administered 2014-10-21: 12 mL/h via EPIDURAL
  Administered 2014-10-21: 14 mL/h via EPIDURAL
  Filled 2014-10-21: qty 125

## 2014-10-21 MED ORDER — PHENYLEPHRINE 40 MCG/ML (10ML) SYRINGE FOR IV PUSH (FOR BLOOD PRESSURE SUPPORT)
80.0000 ug | PREFILLED_SYRINGE | INTRAVENOUS | Status: DC | PRN
  Filled 2014-10-21: qty 20
  Filled 2014-10-21: qty 2

## 2014-10-21 MED ORDER — EPHEDRINE 5 MG/ML INJ
10.0000 mg | INTRAVENOUS | Status: DC | PRN
  Filled 2014-10-21: qty 2

## 2014-10-21 MED ORDER — FENTANYL 2.5 MCG/ML BUPIVACAINE 1/10 % EPIDURAL INFUSION (WH - ANES): 12.0000 mL/h | INTRAMUSCULAR | Status: DC | PRN

## 2014-10-21 MED ORDER — FLEET ENEMA 7-19 GM/118ML RE ENEM: 1.0000 | ENEMA | RECTAL | Status: DC | PRN

## 2014-10-21 MED ORDER — DIPHENHYDRAMINE HCL 50 MG/ML IJ SOLN: 12.5000 mg | INTRAMUSCULAR | Status: DC | PRN

## 2014-10-21 MED ORDER — PHENYLEPHRINE 40 MCG/ML (10ML) SYRINGE FOR IV PUSH (FOR BLOOD PRESSURE SUPPORT): 80.0000 ug | PREFILLED_SYRINGE | INTRAVENOUS | Status: DC | PRN

## 2014-10-21 MED ORDER — OXYCODONE-ACETAMINOPHEN 5-325 MG PO TABS: 2.0000 | ORAL_TABLET | ORAL | Status: DC | PRN

## 2014-10-21 MED ORDER — OXYTOCIN BOLUS FROM INFUSION
500.0000 mL | INTRAVENOUS | Status: DC
  Administered 2014-10-22: 500 mL via INTRAVENOUS

## 2014-10-21 MED ORDER — LIDOCAINE HCL (PF) 1 % IJ SOLN
30.0000 mL | INTRAMUSCULAR | Status: DC | PRN
  Filled 2014-10-21: qty 30

## 2014-10-21 MED ORDER — BUTORPHANOL TARTRATE 1 MG/ML IJ SOLN
1.0000 mg | INTRAMUSCULAR | Status: DC | PRN
Start: 2014-10-21 — End: 2014-10-22

## 2014-10-21 MED ORDER — ONDANSETRON HCL 4 MG/2ML IJ SOLN: 4.0000 mg | Freq: Four times a day (QID) | INTRAMUSCULAR | Status: DC | PRN

## 2014-10-21 MED ORDER — CITRIC ACID-SODIUM CITRATE 334-500 MG/5ML PO SOLN: 30.0000 mL | ORAL | Status: DC | PRN

## 2014-10-21 MED ORDER — LACTATED RINGERS IV SOLN
INTRAVENOUS | Status: DC
  Administered 2014-10-21 (×2): via INTRAVENOUS

## 2014-10-21 MED ORDER — TERBUTALINE SULFATE 1 MG/ML IJ SOLN: 0.2500 mg | Freq: Once | INTRAMUSCULAR | Status: AC | PRN

## 2014-10-21 MED ORDER — OXYTOCIN 40 UNITS IN LACTATED RINGERS INFUSION - SIMPLE MED: 1.0000 m[IU]/min | INTRAVENOUS | Status: DC

## 2014-10-21 MED ORDER — LACTATED RINGERS IV SOLN
500.0000 mL | INTRAVENOUS | Status: DC | PRN
  Administered 2014-10-21: 500 mL via INTRAVENOUS

## 2014-10-21 NOTE — Progress Notes (Signed)
EFM/TOCO removed for epidural placement due to category I tracing.

## 2014-10-21 NOTE — Anesthesia Preprocedure Evaluation (Signed)
Anesthesia Evaluation  Patient identified by MRN, date of birth, ID band Patient awake    Reviewed: Allergy & Precautions, H&P , Patient's Chart, lab work & pertinent test results  Airway Mallampati: II  TM Distance: >3 FB Neck ROM: full    Dental no notable dental hx. (+) Teeth Intact   Pulmonary neg pulmonary ROS,  breath sounds clear to auscultation  Pulmonary exam normal       Cardiovascular negative cardio ROS  Rhythm:regular Rate:Normal     Neuro/Psych negative neurological ROS  negative psych ROS   GI/Hepatic negative GI ROS, (+) Hepatitis -, B  Endo/Other  negative endocrine ROS  Renal/GU negative Renal ROS  negative genitourinary   Musculoskeletal   Abdominal Normal abdominal exam  (+)   Peds  Hematology negative hematology ROS (+)   Anesthesia Other Findings   Reproductive/Obstetrics (+) Pregnancy                             Anesthesia Physical  Anesthesia Plan  ASA: II  Anesthesia Plan: Epidural   Post-op Pain Management:    Induction:   Airway Management Planned:   Additional Equipment:   Intra-op Plan:   Post-operative Plan:   Informed Consent: I have reviewed the patients History and Physical, chart, labs and discussed the procedure including the risks, benefits and alternatives for the proposed anesthesia with the patient or authorized representative who has indicated his/her understanding and acceptance.     Plan Discussed with: Anesthesiologist  Anesthesia Plan Comments:         Anesthesia Quick Evaluation

## 2014-10-21 NOTE — H&P (Signed)
Jennifer Adams is a 34 y.o. female presenting for leaking fluid  34 yo G4P1021 @ 40+0 presents with c/o leaking fluid and painful contractions. Pt began leaking fluid around 7:30 pm this evening. 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  The patient has a history of a prior pregnancy affected by T18. NIPT this pregnancy was low risk female gender. History OB History    Gravida Para Term Preterm AB TAB SAB Ectopic Multiple Living   4 1 1  0 1 1 0 0 0 1     Past Medical History  Diagnosis Date  . No pertinent past medical history   . 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 H/O varicella   . Hepatitis     hepatitis B  . Hepatitis B    Past Surgical History  Procedure Laterality Date  . Dilation and curettage of uterus     Family History: family history includes Cancer in her maternal uncle; Hypertension in her father. Social History:  reports that she has never smoked. She has never used smokeless tobacco. She reports that she does not drink alcohol or use illicit drugs.   Prenatal Transfer Tool  Maternal Diabetes: No Genetic Screening: Normal Maternal Ultrasounds/Referrals: Normal Fetal Ultrasounds or other Referrals:  None Maternal Substance Abuse:  No Significant Maternal Medications:  Meds include: Other: Viread Significant Maternal Lab Results:  None Other Comments:  None  ROS  Dilation: 9 Effacement (%): 100 Station: 0 Exam by:: Honeycutt, RN Blood pressure 121/70, pulse 99, temperature 97.5 F (36.4 C), temperature source Oral, resp. rate 18, height 5\' 3"  (1.6 m), weight 78.472 kg (173 lb), last menstrual period 09/13/2013, SpO2 98 %, unknown if currently breastfeeding. Exam Physical Exam  Prenatal labs: ABO, Rh: --/--/O POS (06/08 2047) Antibody: NEG (06/08 2047) Rubella: Immune (11/17 0000) RPR: Nonreactive (11/17 0000)  HBsAg:   Positive HIV: Non-reactive (11/17 0000)   GBS: Negative (05/06 0000)   Assessment/Plan: 1) Admit 2) Epidural on request 3) pitocin augmentation prn   Jennifer Adams H. 10/21/2014, 11:12 PM

## 2014-10-21 NOTE — MAU Note (Signed)
Called Dr. Tenny Crawoss with report patient G4P1 5357w0d, SROM 7:30 pm contractions every 4 to 5 minutes uncomfortable, desires epidural, cervix 4/90/-1 positive Hep B.

## 2014-10-21 NOTE — MAU Note (Signed)
Pt reports ROM at 1930, contractions.

## 2014-10-21 NOTE — Anesthesia Procedure Notes (Addendum)
Epidural Patient location during procedure: OB Start time: 10/21/2014 9:46 PM  Staffing Anesthesiologist: Mal AmabileFOSTER, Shameeka Silliman  Preanesthetic Checklist Completed: patient identified, site marked, surgical consent, pre-op evaluation, timeout performed, IV checked, risks and benefits discussed and monitors and equipment checked  Epidural Patient position: sitting Prep: site prepped and draped and DuraPrep Patient monitoring: continuous pulse ox and blood pressure Approach: midline Location: L3-L4 Injection technique: LOR air  Needle:  Needle type: Tuohy  Needle gauge: 17 G Needle length: 9 cm and 9 Needle insertion depth: 5 cm cm Catheter type: closed end flexible Catheter size: 19 Gauge Catheter at skin depth: 10 cm Test dose: negative and Other  Assessment Events: blood not aspirated, injection not painful, no injection resistance, negative IV test and no paresthesia  Additional Notes Patient identified. Risks and benefits discussed including failed block, incomplete  Pain control, post dural puncture headache, nerve damage, paralysis, blood pressure Changes, nausea, vomiting, reactions to medications-both toxic and allergic and post Partum back pain. All questions were answered. Patient expressed understanding and wished to proceed. Sterile technique was used throughout procedure. Epidural site was Dressed with sterile barrier dressing. No paresthesias, signs of intravascular injection Or signs of intrathecal spread were encountered.  Patient was more comfortable after the epidural was dosed. Please see RN's note for documentation of vital signs and FHR which are stable.

## 2014-10-22 ENCOUNTER — Encounter (HOSPITAL_COMMUNITY): Payer: Self-pay | Admitting: *Deleted

## 2014-10-22 LAB — CBC
HEMATOCRIT: 32.6 % — AB (ref 36.0–46.0)
Hemoglobin: 11.2 g/dL — ABNORMAL LOW (ref 12.0–15.0)
MCH: 29.9 pg (ref 26.0–34.0)
MCHC: 34.4 g/dL (ref 30.0–36.0)
MCV: 87.2 fL (ref 78.0–100.0)
Platelets: 220 10*3/uL (ref 150–400)
RBC: 3.74 MIL/uL — AB (ref 3.87–5.11)
RDW: 13 % (ref 11.5–15.5)
WBC: 26.4 10*3/uL — AB (ref 4.0–10.5)

## 2014-10-22 LAB — RPR: RPR Ser Ql: NONREACTIVE

## 2014-10-22 MED ORDER — ONDANSETRON HCL 4 MG/2ML IJ SOLN: 4.0000 mg | INTRAMUSCULAR | Status: DC | PRN

## 2014-10-22 MED ORDER — SENNOSIDES-DOCUSATE SODIUM 8.6-50 MG PO TABS
2.0000 | ORAL_TABLET | ORAL | Status: DC
  Administered 2014-10-22: 2 via ORAL
  Filled 2014-10-22: qty 2

## 2014-10-22 MED ORDER — IBUPROFEN 600 MG PO TABS
600.0000 mg | ORAL_TABLET | Freq: Four times a day (QID) | ORAL | Status: DC
  Administered 2014-10-22 – 2014-10-23 (×3): 600 mg via ORAL
  Filled 2014-10-22 (×3): qty 1

## 2014-10-22 MED ORDER — OXYCODONE-ACETAMINOPHEN 5-325 MG PO TABS: 2.0000 | ORAL_TABLET | ORAL | Status: DC | PRN

## 2014-10-22 MED ORDER — OXYCODONE-ACETAMINOPHEN 5-325 MG PO TABS
1.0000 | ORAL_TABLET | ORAL | Status: DC | PRN
  Administered 2014-10-22 – 2014-10-23 (×3): 1 via ORAL
  Filled 2014-10-22 (×3): qty 1

## 2014-10-22 MED ORDER — BENZOCAINE-MENTHOL 20-0.5 % EX AERO
1.0000 "application " | INHALATION_SPRAY | CUTANEOUS | Status: DC | PRN
  Administered 2014-10-22: 1 via TOPICAL
  Filled 2014-10-22: qty 56

## 2014-10-22 MED ORDER — SIMETHICONE 80 MG PO CHEW: 80.0000 mg | CHEWABLE_TABLET | ORAL | Status: DC | PRN

## 2014-10-22 MED ORDER — ACETAMINOPHEN 325 MG PO TABS
650.0000 mg | ORAL_TABLET | ORAL | Status: DC | PRN
  Administered 2014-10-22: 650 mg via ORAL
  Filled 2014-10-22 (×2): qty 2

## 2014-10-22 MED ORDER — DIPHENHYDRAMINE HCL 25 MG PO CAPS: 25.0000 mg | ORAL_CAPSULE | Freq: Four times a day (QID) | ORAL | Status: DC | PRN

## 2014-10-22 MED ORDER — ONDANSETRON HCL 4 MG PO TABS: 4.0000 mg | ORAL_TABLET | ORAL | Status: DC | PRN

## 2014-10-22 MED ORDER — TETANUS-DIPHTH-ACELL PERTUSSIS 5-2.5-18.5 LF-MCG/0.5 IM SUSP: 0.5000 mL | Freq: Once | INTRAMUSCULAR | Status: DC

## 2014-10-22 MED ORDER — WITCH HAZEL-GLYCERIN EX PADS
1.0000 "application " | MEDICATED_PAD | CUTANEOUS | Status: DC | PRN
  Administered 2014-10-22: 1 via TOPICAL

## 2014-10-22 MED ORDER — ZOLPIDEM TARTRATE 5 MG PO TABS: 5.0000 mg | ORAL_TABLET | Freq: Every evening | ORAL | Status: DC | PRN

## 2014-10-22 MED ORDER — PRENATAL MULTIVITAMIN CH
1.0000 | ORAL_TABLET | Freq: Every day | ORAL | Status: DC
  Administered 2014-10-22 – 2014-10-23 (×2): 1 via ORAL
  Filled 2014-10-22 (×2): qty 1

## 2014-10-22 MED ORDER — TENOFOVIR DISOPROXIL FUMARATE 300 MG PO TABS
300.0000 mg | ORAL_TABLET | Freq: Every day | ORAL | Status: DC
  Administered 2014-10-22 – 2014-10-23 (×2): 300 mg via ORAL
  Filled 2014-10-22 (×2): qty 1

## 2014-10-22 MED ORDER — DIBUCAINE 1 % RE OINT
1.0000 "application " | TOPICAL_OINTMENT | RECTAL | Status: DC | PRN
  Administered 2014-10-22: 1 via RECTAL
  Filled 2014-10-22: qty 28

## 2014-10-22 MED ORDER — LANOLIN HYDROUS EX OINT: TOPICAL_OINTMENT | CUTANEOUS | Status: DC | PRN

## 2014-10-22 NOTE — Discharge Planning (Signed)
Patient attended Discharge class "Well After Birth" today.

## 2014-10-22 NOTE — Anesthesia Postprocedure Evaluation (Signed)
Anesthesia Post Note  Patient: Jennifer Adams  Procedure(s) Performed: * No procedures listed *  Anesthesia type: Epidural  Patient location: Mother/Baby  Post pain: Pain level controlled  Post assessment: Post-op Vital signs reviewed  Last Vitals:  Filed Vitals:   10/22/14 0500  BP: 116/69  Pulse: 80  Temp: 37.1 C  Resp: 18    Post vital signs: Reviewed  Level of consciousness:alert  Complications: No apparent anesthesia complications

## 2014-10-22 NOTE — Lactation Note (Signed)
This note was copied from the chart of Jennifer Adams. Lactation Consultation Note Initial visit at 18 hours of age. Mom has chronic hepatis B and is taking Viread/Tenofovir medicataion listed as a L3 no data probably compatible.  Mom has conflicting advise from GI and OB about taking medicine during breastfeeding.  Encouraged mom to continue to breast feed on demand to establish a good milk supply and to discuss with Dr. Discharge plans regarding medication plan.  Baby is latched to left breast in side lying position.  Baby mostly self latching with wide open mouth and good strong jaw movements with audible swallows.  Encouraged mom to hand express colostrum prior to latch and after feedings to rub into nipples.  [facility] LC resources given and discussed.  Encouraged to feed with early cues on demand.  Early newborn behavior discussed.   Mom to call for assist as needed.       Patient Name: Jennifer Adams Jennifer Adams Date: 10/22/2014 Reason for consult: Initial assessment   Maternal Data Has patient been taught Hand Expression?: Yes Does the patient have breastfeeding experience prior to this delivery?: Yes  Feeding Feeding Type: Breast Fed Length of feed:  (observed 10 minutes)  LATCH Score/Interventions Latch: Grasps breast easily, tongue down, lips flanged, rhythmical sucking.  Audible Swallowing: Spontaneous and intermittent  Type of Nipple: Everted at rest and after stimulation  Comfort (Breast/Nipple): Soft / non-tender     Hold (Positioning): Assistance needed to correctly position infant at breast and maintain latch. Intervention(s): Breastfeeding basics reviewed;Support Pillows;Position options;Skin to skin  LATCH Score: 9  Lactation Tools Discussed/Used     Consult Status Consult Status: Follow-up Date: 10/23/14 Follow-up type: In-patient    Shoptaw, Arvella Merles 10/22/2014, 8:00 PM

## 2014-10-22 NOTE — Progress Notes (Signed)
Patient is eating, ambulating, voiding.  Pain control is good.  Filed Vitals:   10/22/14 0301 10/22/14 0323 10/22/14 0405 10/22/14 0500  BP: 115/72 118/46 114/72 116/69  Pulse: 81 64 88 80  Temp:   98.4 F (36.9 C) 98.8 F (37.1 C)  TempSrc:   Oral   Resp:  18  18  Height:      Weight:      SpO2:   99%     Fundus firm Perineum without swelling.  Lab Results  Component Value Date   WBC 16.9* 10/21/2014   HGB 11.9* 10/21/2014   HCT 34.4* 10/21/2014   MCV 88.0 10/21/2014   PLT 195 10/21/2014    --/--/O POS (06/08 2047)/RI  A/P Post partum day 0.  Routine care.  Expect d/c routine.    Rondel Episcopo A

## 2014-10-23 MED ORDER — IBUPROFEN 600 MG PO TABS: 600.0000 mg | ORAL_TABLET | Freq: Four times a day (QID) | ORAL | Status: DC | PRN

## 2014-10-23 MED ORDER — OXYCODONE-ACETAMINOPHEN 5-325 MG PO TABS: 1.0000 | ORAL_TABLET | Freq: Four times a day (QID) | ORAL | Status: DC | PRN

## 2014-10-23 NOTE — Discharge Summary (Signed)
Obstetric Discharge Summary Reason for Admission: rupture of membranes Prenatal Procedures: none Intrapartum Procedures: spontaneous vaginal delivery Postpartum Procedures: none Complications-Operative and Postpartum: none HEMOGLOBIN  Date Value Ref Range Status  10/22/2014 11.2* 12.0 - 15.0 g/dL Final   HCT  Date Value Ref Range Status  10/22/2014 32.6* 36.0 - 46.0 % Final    Physical Exam:  General: alert, cooperative and appears stated age 34: appropriate Uterine Fundus: firm DVT Evaluation: No evidence of DVT seen on physical exam.  Discharge Diagnoses: Term Pregnancy-delivered  Discharge Information: Date: 10/23/2014 Activity: pelvic rest Diet: routine Medications: PNV, Ibuprofen and Percocet Condition: stable Instructions: refer to practice specific booklet Discharge to: home Follow-up Information    Follow up with Almon Hercules., MD In 4 weeks.   Specialty:  Obstetrics and Gynecology   Contact information:   [address] [place] [address] [phone]       Newborn Data: Live born female  Birth Weight: 8 lb 7.5 oz (3840 g) APGAR: 9, 9  Home with mother.  Jennifer Adams Jennifer Adams 10/23/2014, 10:46 AM

## 2014-10-23 NOTE — Discharge Instructions (Signed)

## 2014-10-23 NOTE — Progress Notes (Signed)
Patient is eating, ambulating, voiding.  Pain control is good.  Lochia is appropriate.    Filed Vitals:   10/22/14 0500 10/22/14 1001 10/22/14 1546 10/23/14 0604  BP: 116/69 107/64 101/72 129/81  Pulse: 80 81 75 66  Temp: 98.8 F (37.1 C) 98.6 F (37 C) 97.9 F (36.6 C) 98.1 F (36.7 C)  TempSrc:  Axillary Oral Oral  Resp: 18 20 18 16   Height:      Weight:      SpO2:  97% 97%     Fundus firm Perineum without swelling.  Lab Results  Component Value Date   WBC 26.4* 10/22/2014   HGB 11.2* 10/22/2014   HCT 32.6* 10/22/2014   MCV 87.2 10/22/2014   PLT 220 10/22/2014    --/--/O POS (06/08 2047)/RI  A/P Post partum day #1  Routine care.   Desires d/c today  [facility] GEFFEL [employer]

## 2016-04-07 IMAGING — US US OB TRANSVAGINAL
1 series · 14 of 28 positions shown · non-contrast
Comparison: None.

CLINICAL DATA: Vaginal bleeding.  First trimester pregnancy.

EXAM:
OBSTETRIC <14 WK US AND TRANSVAGINAL OB US
TECHNIQUE: Both transabdominal and transvaginal ultrasound examinations were
performed for complete evaluation of the gestation as well as the
maternal uterus, adnexal regions, and pelvic cul-de-sac.
Transvaginal technique was performed to assess early pregnancy.

[Series 1: us ob comp less 14 wks · 76 acquisitions, 14 frames shown]
[im 3/76]
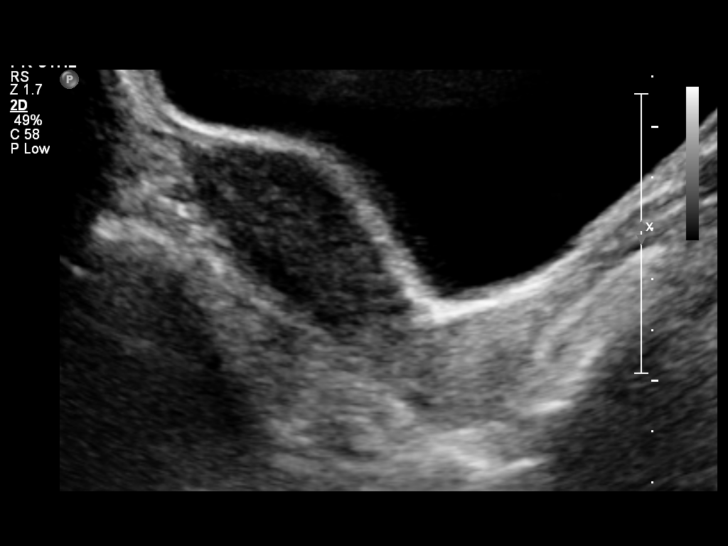
[im 9/76]
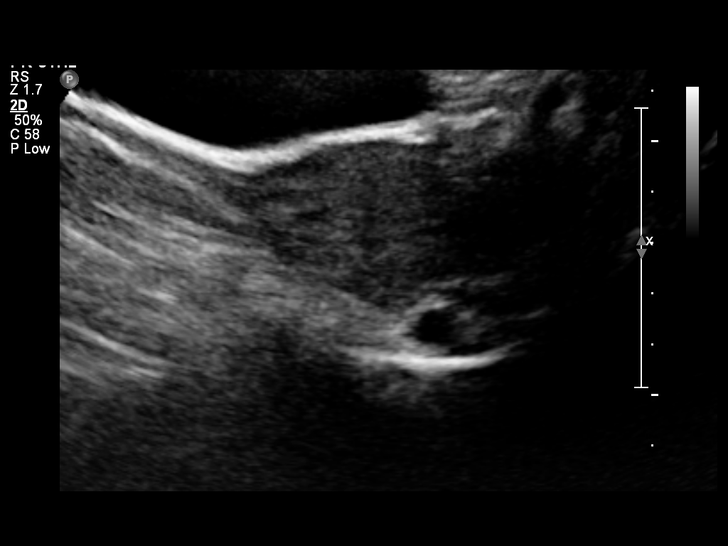
[im 14/76]
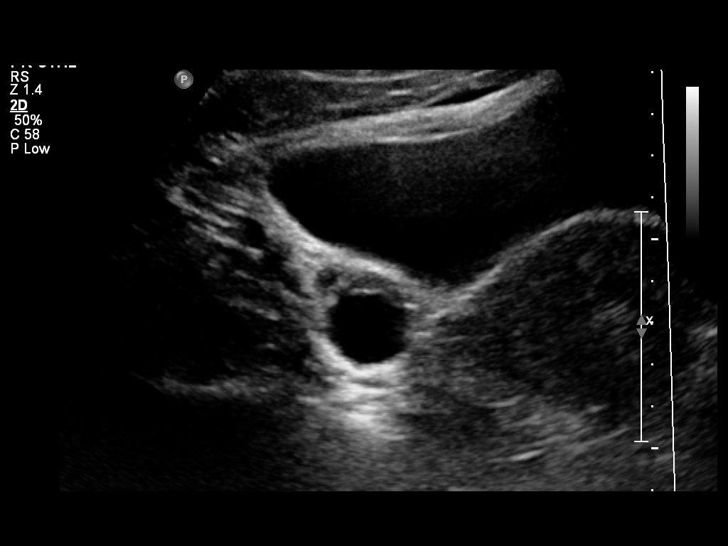
[im 20/76]
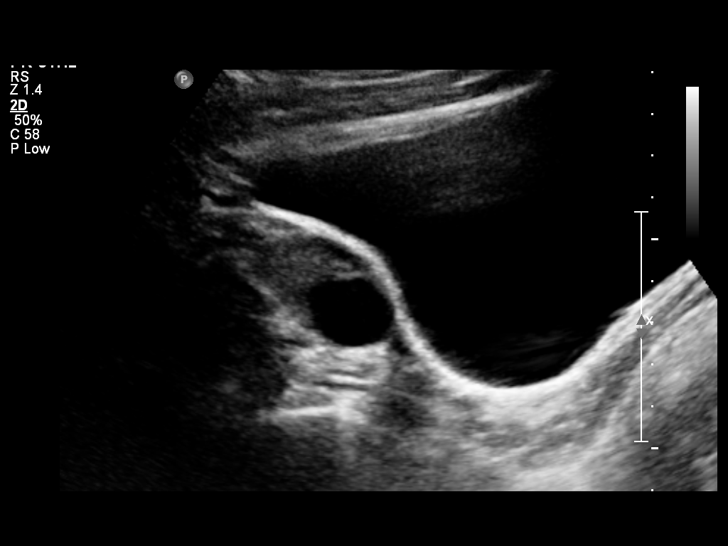
[im 26/76]
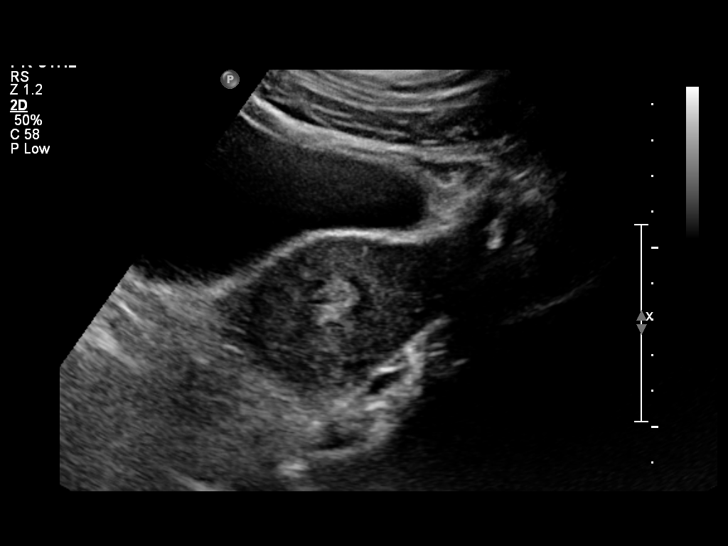
[im 31/76]
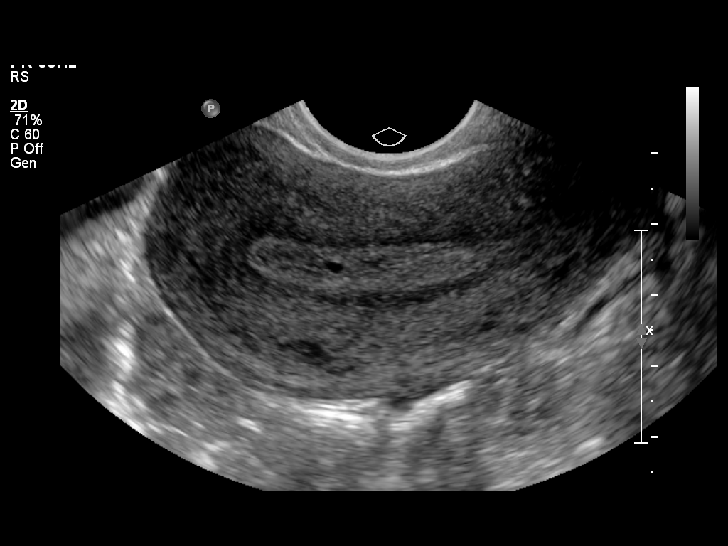
[im 37/76]
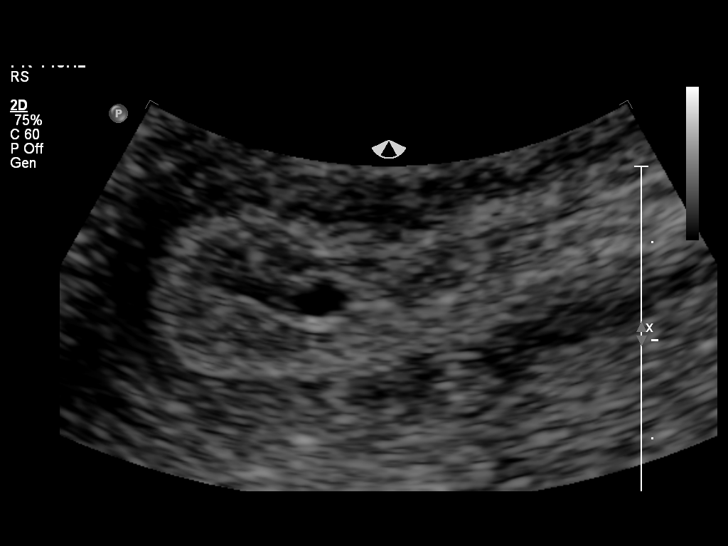
[im 42/76]
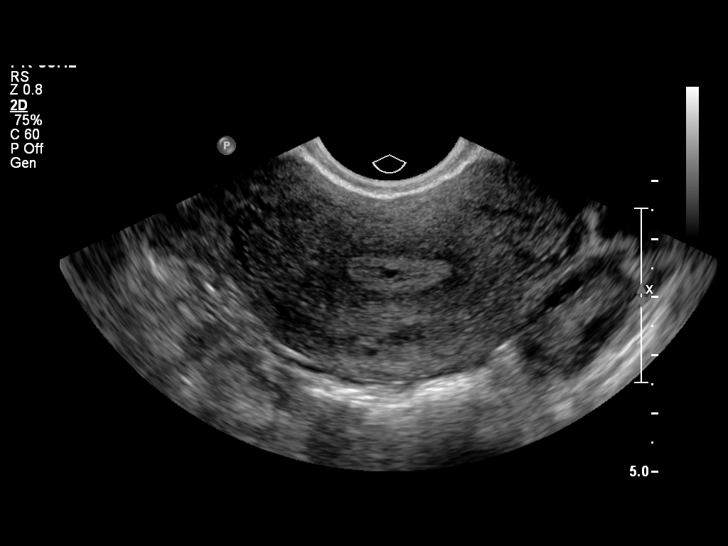
[im 48/76]
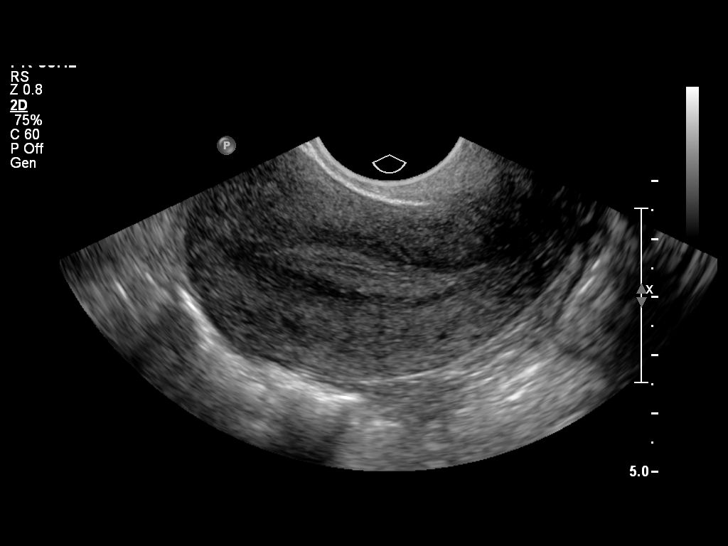
[im 53/76]
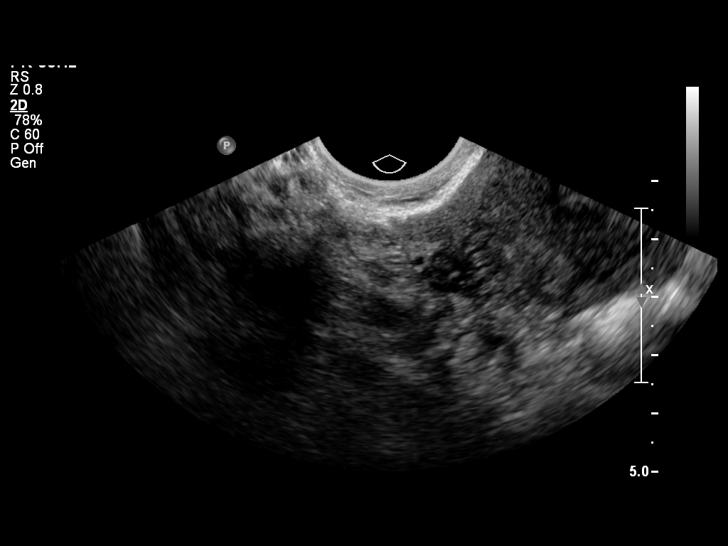
[im 59/76]
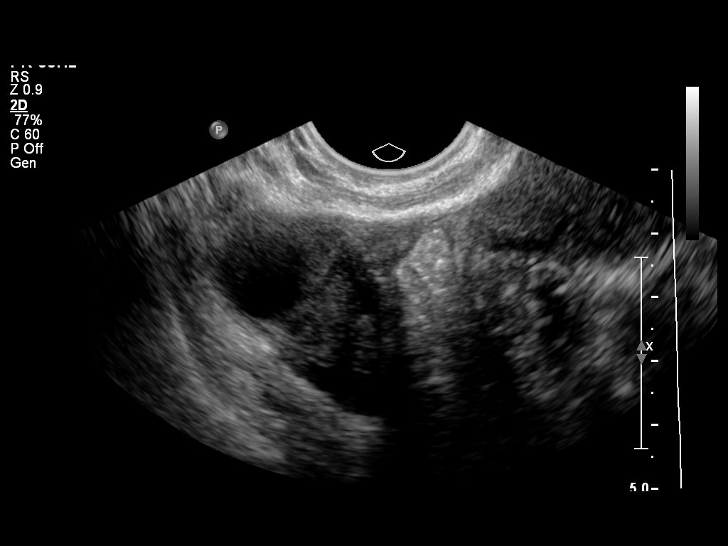
[im 64/76]
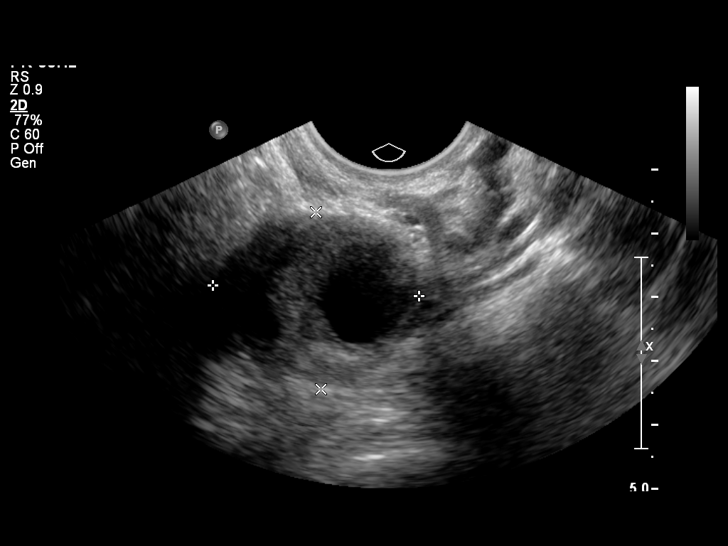
[im 70/76]
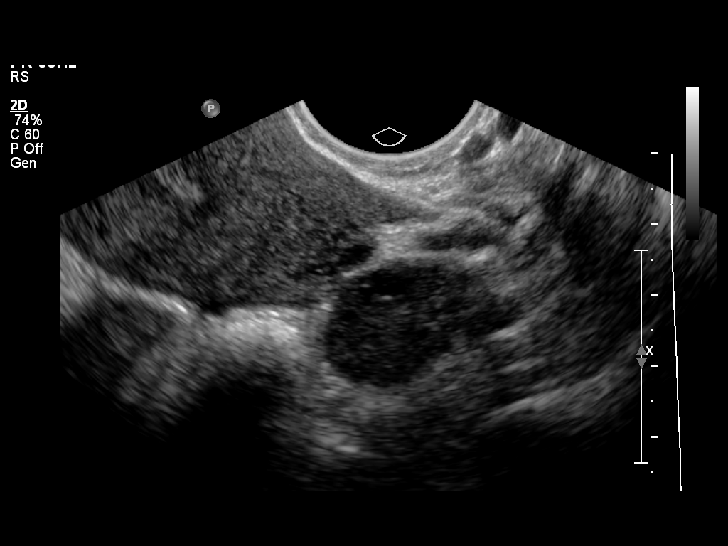
[im 76/76]
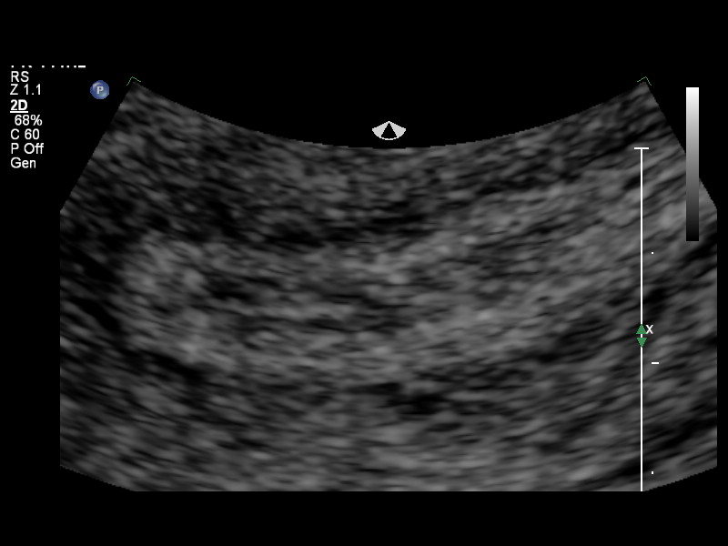

[14 of 28 positions shown; findings below may reference images not displayed]

FINDINGS: Intrauterine gestational sac: Visualized but small.

Yolk sac:  Not visualized

Embryo:  Not visualized

Cardiac Activity: Not visualized

MSD:  3  mm   4 w   5  d

US EDC: 07/05/2014

Maternal uterus/adnexae: 2 cm cyst, right ovary. Adnexa otherwise
unremarkable. No free pelvic fluid.
IMPRESSION: 1. 3 mm fluid collection favoring gestational sac in the endometrial
cavity. Probable early intrauterine gestational sac, but no yolk
sac, fetal pole, or cardiac activity yet visualized. Recommend
follow-up quantitative B-HCG levels and follow-up US in 14 days to
confirm and assess viability. This recommendation follows SRU
consensus guidelines: Diagnostic Criteria for Nonviable Pregnancy
Early in the First Trimester. N Engl J Med 6111; [DATE].

## 2016-04-24 LAB — OB RESULTS CONSOLE ABO/RH: RH Type: POSITIVE

## 2016-04-24 LAB — OB RESULTS CONSOLE HIV ANTIBODY (ROUTINE TESTING): HIV: NONREACTIVE

## 2016-04-24 LAB — OB RESULTS CONSOLE GC/CHLAMYDIA
CHLAMYDIA, DNA PROBE: NEGATIVE
GC PROBE AMP, GENITAL: NEGATIVE

## 2016-04-24 LAB — OB RESULTS CONSOLE RPR: RPR: NONREACTIVE

## 2016-04-24 LAB — OB RESULTS CONSOLE HEPATITIS B SURFACE ANTIGEN: Hepatitis B Surface Ag: POSITIVE

## 2016-04-24 LAB — OB RESULTS CONSOLE ANTIBODY SCREEN: Antibody Screen: NEGATIVE

## 2016-04-24 LAB — OB RESULTS CONSOLE RUBELLA ANTIBODY, IGM: RUBELLA: IMMUNE

## 2016-05-15 NOTE — L&D Delivery Note (Signed)
Delivery Note Patient pushed well for < 10 minutes.  At 1:22 PM a viable female was delivered via Vaginal, Spontaneous Delivery (Presentation:Direct OA ).  APGAR: 9, 9; weight pending .   Placenta status:spontaneous , in tact .  Cord: 3V with the following complications: .  Cord pH: n/a  Following delivery, immediate cord clamping occurred to allow for private cord blood collection per patient request.   Anesthesia:  Epidural Episiotomy: None Lacerations: None Suture Repair: n/a Est. Blood Loss (mL): 200  Mom to postpartum.  Baby to Couplet care / Skin to Skin.  Agron Swiney GEFFEL Melaya Hoselton 11/17/2016, 1:48 PM

## 2016-10-17 ENCOUNTER — Encounter (HOSPITAL_COMMUNITY): Payer: Self-pay

## 2016-10-17 ENCOUNTER — Other Ambulatory Visit (HOSPITAL_COMMUNITY): Payer: Self-pay | Admitting: Obstetrics and Gynecology

## 2016-10-17 DIAGNOSIS — Z3A36 36 weeks gestation of pregnancy: Secondary | ICD-10-CM

## 2016-10-17 DIAGNOSIS — O26843 Uterine size-date discrepancy, third trimester: Secondary | ICD-10-CM

## 2016-10-17 LAB — OB RESULTS CONSOLE GBS: STREP GROUP B AG: NEGATIVE

## 2016-10-19 ENCOUNTER — Encounter (HOSPITAL_COMMUNITY): Payer: Self-pay

## 2016-10-19 ENCOUNTER — Ambulatory Visit (HOSPITAL_COMMUNITY)
Admission: RE | Admit: 2016-10-19 | Discharge: 2016-10-19 | Disposition: A | Discharge status: Home / Self Care | Payer: Managed Care, Other (non HMO) | Source: Ambulatory Visit | Attending: Obstetrics and Gynecology | Admitting: Obstetrics and Gynecology

## 2016-10-19 ENCOUNTER — Other Ambulatory Visit (HOSPITAL_COMMUNITY): Payer: Self-pay | Admitting: Obstetrics and Gynecology

## 2016-10-19 ENCOUNTER — Ambulatory Visit (HOSPITAL_COMMUNITY): Admission: RE | Admit: 2016-10-19 | Payer: Managed Care, Other (non HMO) | Source: Ambulatory Visit

## 2016-10-19 DIAGNOSIS — O98413 Viral hepatitis complicating pregnancy, third trimester: Secondary | ICD-10-CM | POA: Diagnosis not present

## 2016-10-19 DIAGNOSIS — Z3689 Encounter for other specified antenatal screening: Secondary | ICD-10-CM

## 2016-10-19 DIAGNOSIS — B181 Chronic viral hepatitis B without delta-agent: Secondary | ICD-10-CM | POA: Insufficient documentation

## 2016-10-19 DIAGNOSIS — B1910 Unspecified viral hepatitis B without hepatic coma: Secondary | ICD-10-CM

## 2016-10-19 DIAGNOSIS — Z3A36 36 weeks gestation of pregnancy: Secondary | ICD-10-CM | POA: Diagnosis not present

## 2016-10-19 DIAGNOSIS — O09299 Supervision of pregnancy with other poor reproductive or obstetric history, unspecified trimester: Secondary | ICD-10-CM

## 2016-10-19 DIAGNOSIS — O26843 Uterine size-date discrepancy, third trimester: Secondary | ICD-10-CM

## 2016-10-19 DIAGNOSIS — O09293 Supervision of pregnancy with other poor reproductive or obstetric history, third trimester: Secondary | ICD-10-CM | POA: Diagnosis not present

## 2016-11-13 ENCOUNTER — Encounter (HOSPITAL_COMMUNITY): Payer: Self-pay | Admitting: *Deleted

## 2016-11-13 ENCOUNTER — Telehealth (HOSPITAL_COMMUNITY): Payer: Self-pay | Admitting: *Deleted

## 2016-11-13 NOTE — Telephone Encounter (Signed)
Preadmission screen  

## 2016-11-17 ENCOUNTER — Inpatient Hospital Stay (HOSPITAL_COMMUNITY)
Admission: RE | Admit: 2016-11-17 | Discharge: 2016-11-19 | DRG: 774 | Disposition: A | Discharge status: Home / Self Care | Payer: Managed Care, Other (non HMO) | Source: Ambulatory Visit | Attending: Obstetrics | Admitting: Obstetrics

## 2016-11-17 ENCOUNTER — Inpatient Hospital Stay (HOSPITAL_COMMUNITY): Payer: Managed Care, Other (non HMO) | Admitting: Anesthesiology

## 2016-11-17 ENCOUNTER — Encounter (HOSPITAL_COMMUNITY): Payer: Self-pay

## 2016-11-17 DIAGNOSIS — O9842 Viral hepatitis complicating childbirth: Secondary | ICD-10-CM | POA: Diagnosis present

## 2016-11-17 DIAGNOSIS — O480 Post-term pregnancy: Principal | ICD-10-CM | POA: Diagnosis present

## 2016-11-17 DIAGNOSIS — B181 Chronic viral hepatitis B without delta-agent: Secondary | ICD-10-CM | POA: Diagnosis present

## 2016-11-17 DIAGNOSIS — Z3A41 41 weeks gestation of pregnancy: Secondary | ICD-10-CM

## 2016-11-17 LAB — TYPE AND SCREEN
ABO/RH(D): O POS
Antibody Screen: NEGATIVE

## 2016-11-17 LAB — CBC
HEMATOCRIT: 36.3 % (ref 36.0–46.0)
Hemoglobin: 12.2 g/dL (ref 12.0–15.0)
MCH: 30.3 pg (ref 26.0–34.0)
MCHC: 33.6 g/dL (ref 30.0–36.0)
MCV: 90.3 fL (ref 78.0–100.0)
Platelets: 239 10*3/uL (ref 150–400)
RBC: 4.02 MIL/uL (ref 3.87–5.11)
RDW: 13.2 % (ref 11.5–15.5)
WBC: 10.2 10*3/uL (ref 4.0–10.5)

## 2016-11-17 LAB — COMPREHENSIVE METABOLIC PANEL
ALBUMIN: 3.4 g/dL — AB (ref 3.5–5.0)
ALK PHOS: 123 U/L (ref 38–126)
ALT: 19 U/L (ref 14–54)
AST: 25 U/L (ref 15–41)
Anion gap: 9 (ref 5–15)
BILIRUBIN TOTAL: 0.6 mg/dL (ref 0.3–1.2)
BUN: 7 mg/dL (ref 6–20)
CALCIUM: 9.2 mg/dL (ref 8.9–10.3)
CO2: 19 mmol/L — AB (ref 22–32)
CREATININE: 0.57 mg/dL (ref 0.44–1.00)
Chloride: 107 mmol/L (ref 101–111)
GFR calc Af Amer: >60 mL/min (ref >60)
GFR calc non Af Amer: >60 mL/min (ref >60)
GLUCOSE: 125 mg/dL — AB (ref 65–99)
Potassium: 3.5 mmol/L (ref 3.5–5.1)
SODIUM: 135 mmol/L (ref 135–145)
TOTAL PROTEIN: 6.1 g/dL — AB (ref 6.5–8.1)

## 2016-11-17 LAB — RPR: RPR Ser Ql: NONREACTIVE

## 2016-11-17 MED ORDER — IBUPROFEN 600 MG PO TABS
600.0000 mg | ORAL_TABLET | Freq: Four times a day (QID) | ORAL | Status: DC
  Administered 2016-11-17 – 2016-11-19 (×7): 600 mg via ORAL
  Filled 2016-11-17 (×7): qty 1

## 2016-11-17 MED ORDER — PHENYLEPHRINE 40 MCG/ML (10ML) SYRINGE FOR IV PUSH (FOR BLOOD PRESSURE SUPPORT)
80.0000 ug | PREFILLED_SYRINGE | INTRAVENOUS | Status: DC | PRN
  Filled 2016-11-17: qty 5
  Filled 2016-11-17: qty 10

## 2016-11-17 MED ORDER — BENZOCAINE-MENTHOL 20-0.5 % EX AERO: 1.0000 "application " | INHALATION_SPRAY | CUTANEOUS | Status: DC | PRN

## 2016-11-17 MED ORDER — WITCH HAZEL-GLYCERIN EX PADS: 1.0000 "application " | MEDICATED_PAD | CUTANEOUS | Status: DC | PRN

## 2016-11-17 MED ORDER — PRENATAL MULTIVITAMIN CH
1.0000 | ORAL_TABLET | Freq: Every day | ORAL | Status: DC
  Administered 2016-11-18 – 2016-11-19 (×2): 1 via ORAL
  Filled 2016-11-17 (×2): qty 1

## 2016-11-17 MED ORDER — LACTATED RINGERS IV SOLN: 500.0000 mL | INTRAVENOUS | Status: DC | PRN

## 2016-11-17 MED ORDER — OXYCODONE HCL 5 MG PO TABS: 10.0000 mg | ORAL_TABLET | ORAL | Status: DC | PRN

## 2016-11-17 MED ORDER — FENTANYL CITRATE (PF) 100 MCG/2ML IJ SOLN: 50.0000 ug | INTRAMUSCULAR | Status: DC | PRN

## 2016-11-17 MED ORDER — DIPHENHYDRAMINE HCL 50 MG/ML IJ SOLN: 12.5000 mg | INTRAMUSCULAR | Status: DC | PRN

## 2016-11-17 MED ORDER — ACETAMINOPHEN 325 MG PO TABS: 650.0000 mg | ORAL_TABLET | ORAL | Status: DC | PRN

## 2016-11-17 MED ORDER — OXYTOCIN 40 UNITS IN LACTATED RINGERS INFUSION - SIMPLE MED: 2.5000 [IU]/h | INTRAVENOUS | Status: DC

## 2016-11-17 MED ORDER — TERBUTALINE SULFATE 1 MG/ML IJ SOLN
0.2500 mg | Freq: Once | INTRAMUSCULAR | Status: DC | PRN
  Filled 2016-11-17: qty 1

## 2016-11-17 MED ORDER — PHENYLEPHRINE 40 MCG/ML (10ML) SYRINGE FOR IV PUSH (FOR BLOOD PRESSURE SUPPORT)
80.0000 ug | PREFILLED_SYRINGE | INTRAVENOUS | Status: DC | PRN
  Administered 2016-11-17 (×2): 80 ug via INTRAVENOUS
  Filled 2016-11-17: qty 5

## 2016-11-17 MED ORDER — TENOFOVIR DISOPROXIL FUMARATE 300 MG PO TABS
300.0000 mg | ORAL_TABLET | Freq: Every day | ORAL | Status: DC
  Administered 2016-11-18 – 2016-11-19 (×2): 300 mg via ORAL
  Filled 2016-11-17 (×3): qty 1

## 2016-11-17 MED ORDER — DIBUCAINE 1 % RE OINT: 1.0000 "application " | TOPICAL_OINTMENT | RECTAL | Status: DC | PRN

## 2016-11-17 MED ORDER — SIMETHICONE 80 MG PO CHEW: 80.0000 mg | CHEWABLE_TABLET | ORAL | Status: DC | PRN

## 2016-11-17 MED ORDER — OXYCODONE-ACETAMINOPHEN 5-325 MG PO TABS: 2.0000 | ORAL_TABLET | ORAL | Status: DC | PRN

## 2016-11-17 MED ORDER — OXYCODONE-ACETAMINOPHEN 5-325 MG PO TABS: 1.0000 | ORAL_TABLET | ORAL | Status: DC | PRN

## 2016-11-17 MED ORDER — EPHEDRINE 5 MG/ML INJ
10.0000 mg | INTRAVENOUS | Status: DC | PRN
  Filled 2016-11-17: qty 2

## 2016-11-17 MED ORDER — SENNOSIDES-DOCUSATE SODIUM 8.6-50 MG PO TABS
2.0000 | ORAL_TABLET | ORAL | Status: DC
  Administered 2016-11-18 (×2): 2 via ORAL
  Filled 2016-11-17 (×2): qty 2

## 2016-11-17 MED ORDER — LACTATED RINGERS IV SOLN
INTRAVENOUS | Status: DC
  Administered 2016-11-17 (×3): via INTRAVENOUS

## 2016-11-17 MED ORDER — LIDOCAINE HCL (PF) 1 % IJ SOLN
30.0000 mL | INTRAMUSCULAR | Status: DC | PRN
  Filled 2016-11-17: qty 30

## 2016-11-17 MED ORDER — COCONUT OIL OIL
1.0000 "application " | TOPICAL_OIL | Status: DC | PRN
  Administered 2016-11-18: 1 via TOPICAL
  Filled 2016-11-17: qty 120

## 2016-11-17 MED ORDER — OXYTOCIN BOLUS FROM INFUSION
500.0000 mL | Freq: Once | INTRAVENOUS | Status: AC
  Administered 2016-11-17: 500 mL via INTRAVENOUS

## 2016-11-17 MED ORDER — LACTATED RINGERS IV SOLN: 500.0000 mL | Freq: Once | INTRAVENOUS | Status: DC

## 2016-11-17 MED ORDER — ACETAMINOPHEN 325 MG PO TABS
650.0000 mg | ORAL_TABLET | ORAL | Status: DC | PRN
  Administered 2016-11-18 (×2): 650 mg via ORAL
  Filled 2016-11-17 (×2): qty 2

## 2016-11-17 MED ORDER — ONDANSETRON HCL 4 MG PO TABS: 4.0000 mg | ORAL_TABLET | ORAL | Status: DC | PRN

## 2016-11-17 MED ORDER — OXYTOCIN 40 UNITS IN LACTATED RINGERS INFUSION - SIMPLE MED
1.0000 m[IU]/min | INTRAVENOUS | Status: DC
  Administered 2016-11-17: 2 m[IU]/min via INTRAVENOUS
  Filled 2016-11-17: qty 1000

## 2016-11-17 MED ORDER — ONDANSETRON HCL 4 MG/2ML IJ SOLN: 4.0000 mg | Freq: Four times a day (QID) | INTRAMUSCULAR | Status: DC | PRN

## 2016-11-17 MED ORDER — OXYCODONE HCL 5 MG PO TABS: 5.0000 mg | ORAL_TABLET | ORAL | Status: DC | PRN

## 2016-11-17 MED ORDER — TETANUS-DIPHTH-ACELL PERTUSSIS 5-2.5-18.5 LF-MCG/0.5 IM SUSP: 0.5000 mL | Freq: Once | INTRAMUSCULAR | Status: DC

## 2016-11-17 MED ORDER — SOD CITRATE-CITRIC ACID 500-334 MG/5ML PO SOLN: 30.0000 mL | ORAL | Status: DC | PRN

## 2016-11-17 MED ORDER — DIPHENHYDRAMINE HCL 25 MG PO CAPS: 25.0000 mg | ORAL_CAPSULE | Freq: Four times a day (QID) | ORAL | Status: DC | PRN

## 2016-11-17 MED ORDER — ONDANSETRON HCL 4 MG/2ML IJ SOLN: 4.0000 mg | INTRAMUSCULAR | Status: DC | PRN

## 2016-11-17 MED ORDER — FENTANYL 2.5 MCG/ML BUPIVACAINE 1/10 % EPIDURAL INFUSION (WH - ANES)
14.0000 mL/h | INTRAMUSCULAR | Status: DC | PRN
  Filled 2016-11-17: qty 100

## 2016-11-17 NOTE — Lactation Note (Signed)
This note was copied from a baby's chart. Lactation Consultation Note  Patient Name: Jennifer Adams Today's Date: 11/17/2016 Reason for consult: Initial assessment   Initial assessment with Exp BF [facility] < [facility]. Mom with chronic Hep B. Mom on Viread and reports she was on Viread with her other children also. Bobbye Mortonhomas Hale reports Viread in Medications and Mother's Milk- Limited data-Probably compatible as long as mother not HIV positive- This mother's prenatal labs show HIV is negative.    Mom has infant latched and actively feeding when LC arrived in room. Mom reports no pain/pinching with feeding. She reports she BF her sons for 18 months each. Infant feeding actively. Enc mom to feed infant STS 8-12 x in 24 hours at first feeding cues using head and pillow support with feeding, offering both breasts with each feeding. Enc mom to massage/compress breasts with feedings. Mom showed that she was able to hand express colostrum. Mom with soft compressible breasts and areola with everted nipples. Breasts is somewhat cone shaped, mom was able to successfully exclusively BF 2 older children, so LC feels shape of breasts are insignificant in this case.     Reviewed colostrum, milk coming to volume, supply and demand, hand expression, and cluster feeding. BF resources handout and LC Brochure given, mom informed of IP/OP Services, BF Support Groups and LC phone #. Mom has a Medela pump at home. Mom without questions/concerns at this time.   Maternal Data Formula Feeding for Exclusion: No Has patient been taught Hand Expression?: Yes Does the patient have breastfeeding experience prior to this delivery?: Yes  Feeding    LATCH Score/Interventions Latch: Grasps breast easily, tongue down, lips flanged, rhythmical sucking.  Audible Swallowing: Spontaneous and intermittent  Type of Nipple: Everted at rest and after stimulation  Comfort (Breast/Nipple): Soft / non-tender      Hold (Positioning): No assistance needed to correctly position infant at breast.  LATCH Score: 10  Lactation Tools Discussed/Used     Consult Status Consult Status: Follow-up Date: 11/18/16 Follow-up type: In-patient    Silas FloodSharon S Vastie Douty 11/17/2016, 2:34 PM

## 2016-11-17 NOTE — Anesthesia Pain Management Evaluation Note (Signed)
  CRNA Pain Management Visit Note  Patient: Jennifer Adams, 35 y.o., female  "Hello I am a [facility] of the anesthesia team at [facility]. We have an anesthesia team available at all times to provide care throughout the hospital, including epidural management and anesthesia for C-section. I don't know your plan for the delivery whether it a natural birth, water birth, IV sedation, nitrous supplementation, doula or epidural, but we want to meet your pain goals."   1.Was your pain managed to your expectations on prior hospitalizations?   Yes   2.What is your expectation for pain management during this hospitalization?     Epidural  3.How can we help you reach that goal? epidural  Record the patient's initial score and the patient's pain goal.   Pain: 0  Pain Goal: 4 [facility] wants you to be able to say your pain was always managed very well.  Jennifer Adams 11/17/2016

## 2016-11-17 NOTE — H&P (Signed)
36 y.o. [id number] @ 615w0d presents for IOL for post term pregnancy.  Otherwise has good fetal movement and no bleeding.  Pregnancy c/b: 1. Chronic hepatitis B: followed by GI (Dr. Jason FilaBray).  On Viread. Viral quants have been negative throughout this pregnancy with normal LFTs.  Last viral load in April was negative.  2. S<D:  MFM referral at 36 weeks--normal growth  Past Medical History:  Diagnosis Date  . H/O varicella   . Hepatitis    hepatitis B  . Hepatitis B   . No pertinent past medical history   . Trisomy 18 in child of prior pregnancy, currently pregnant     Past Surgical History:  Procedure Laterality Date  . DILATION AND CURETTAGE OF UTERUS      OB History  Gravida Para Term Preterm AB Living  5 2 2  0 2 2  SAB TAB Ectopic Multiple Live Births  1 1 0 0 2    # Outcome Date GA Lbr Len/2nd Weight Sex Delivery Anes PTL Lv  5 Current           4 Term 10/22/14 6563w1d 04:06 / 01:41 3.84 kg (8 lb 7.5 oz) M Vag-Spont EPI  LIV  3 Term 05/30/12 5370w1d  3.719 kg (8 lb 3.2 oz) M Vag-Spont EPI  LIV     Birth Comments: WNL  2 TAB 11/2010 8563w0d       DEC     Birth Comments: Termination due to fetal anomaly's, ? Trisomy 18  1 SAB               Social History   Social History  . Marital status: Married    Spouse name: N/A  . Number of children: N/A  . Years of education: N/A   Occupational History  . Not on file.   Social History Main Topics  . Smoking status: Never Smoker  . Smokeless tobacco: Never Used  . Alcohol use No  . Drug use: No  . Sexual activity: Not Currently   Other Topics Concern  . Not on file   Social History Narrative  . No narrative on file   Patient has no known allergies.    Prenatal Transfer Tool  Maternal Diabetes: No Genetic Screening: Normal Maternal Ultrasounds/Referrals: Abnormal:  Findings:   Isolated choroid plexus cyst Fetal Ultrasounds or other Referrals:  Referred to Materal Fetal Medicine  Maternal Substance Abuse:  No Significant  Maternal Medications:  Meds include: Other:  Viread Significant Maternal Lab Results: Lab values include: HBsAG positive  ABO, Rh: O/Positive/-- (12/11 0000) Antibody: Negative (12/11 0000) Rubella: Immune (12/11 0000) RPR: Nonreactive (12/11 0000)  HBsAg: Positive (12/11 0000)  HIV: Non-reactive (12/11 0000)  GBS:   Negative      Vitals:   11/17/16 0706  BP: (!) 120/59  Pulse: 74  Resp: 18  Temp: 97.8 F (36.6 C)     General:  NAD Abdomen:  soft, gravid Ex:  no edema SVE:  3-4/50/-2 FHTs:  130s, mod var, Cat 1 Toco:  quiet   A/P   36 y.o. [id number] 265w0d presents for IOL for post term pregnancy IOL w pitocin.  Avoid FSE  Chronic hepatitis B--cont viread.  Will make peds aware of diagnosis.  FSR/ vtx/ GBS neg  Nicandro Perrault GEFFEL Makaylin Carlo

## 2016-11-17 NOTE — Anesthesia Postprocedure Evaluation (Signed)
Anesthesia Post Note  Patient: Jennifer Adams  Procedure(s) Performed: * No procedures listed *     Patient location during evaluation: Mother Baby Anesthesia Type: Epidural Level of consciousness: awake and alert and oriented Pain management: satisfactory to patient Vital Signs Assessment: post-procedure vital signs reviewed and stable Respiratory status: spontaneous breathing and nonlabored ventilation Cardiovascular status: stable Postop Assessment: no headache, no backache, no signs of nausea or vomiting, adequate PO intake and patient able to bend at knees (patient up walking) Anesthetic complications: no    Last Vitals:  Vitals:   11/17/16 1645 11/17/16 2026  BP: (!) 104/59 (!) 107/59  Pulse: (!) 58 (!) 57  Resp: 18 18  Temp: 36.7 C 36.4 C    Last Pain:  Vitals:   11/17/16 2026  TempSrc: Axillary  PainSc: 2    Pain Goal: Patients Stated Pain Goal: 3 (11/17/16 1645)               Madison HickmanGREGORY,Americus Perkey

## 2016-11-17 NOTE — Anesthesia Preprocedure Evaluation (Signed)
Anesthesia Evaluation  Patient identified by MRN, date of birth, ID band Patient awake    Reviewed: Allergy & Precautions, H&P , NPO status , Patient's Chart, lab work & pertinent test results  Airway Mallampati: I  TM Distance: >3 FB Neck ROM: full    Dental no notable dental hx. (+) Teeth Intact   Pulmonary neg pulmonary ROS,    Pulmonary exam normal breath sounds clear to auscultation       Cardiovascular negative cardio ROS Normal cardiovascular exam Rhythm:regular Rate:Normal     Neuro/Psych negative neurological ROS  negative psych ROS   GI/Hepatic negative GI ROS,   Endo/Other  negative endocrine ROS  Renal/GU negative Renal ROS  negative genitourinary   Musculoskeletal   Abdominal Normal abdominal exam  (+)   Peds  Hematology negative hematology ROS (+)   Anesthesia Other Findings   Reproductive/Obstetrics (+) Pregnancy                             Anesthesia Physical Anesthesia Plan  ASA: II  Anesthesia Plan: Epidural   Post-op Pain Management:    Induction:   PONV Risk Score and Plan:   Airway Management Planned:   Additional Equipment:   Intra-op Plan:   Post-operative Plan:   Informed Consent: I have reviewed the patients History and Physical, chart, labs and discussed the procedure including the risks, benefits and alternatives for the proposed anesthesia with the patient or authorized representative who has indicated his/her understanding and acceptance.     Plan Discussed with:   Anesthesia Plan Comments:         Anesthesia Quick Evaluation

## 2016-11-17 NOTE — Anesthesia Procedure Notes (Signed)
Epidural Patient location during procedure: OB Start time: 11/17/2016 9:27 AM End time: 11/17/2016 9:30 AM  Staffing Anesthesiologist: Leilani AbleHATCHETT, Hudson Lehmkuhl Performed: anesthesiologist   Preanesthetic Checklist Completed: patient identified, site marked, surgical consent, pre-op evaluation, timeout performed, IV checked, risks and benefits discussed and monitors and equipment checked  Epidural Patient position: sitting Prep: site prepped and draped and DuraPrep Patient monitoring: continuous pulse ox and blood pressure Approach: midline Location: L3-L4 Injection technique: LOR air  Needle:  Needle type: Tuohy  Needle gauge: 17 G Needle length: 9 cm and 9 Needle insertion depth: 4 cm Catheter type: closed end flexible Catheter size: 19 Gauge Catheter at skin depth: 9 cm Test dose: negative and Other  Assessment Sensory level: T9 Events: blood not aspirated, injection not painful, no injection resistance, negative IV test and no paresthesia  Additional Notes Reason for block:procedure for pain

## 2016-11-18 LAB — CBC
HEMATOCRIT: 32.8 % — AB (ref 36.0–46.0)
Hemoglobin: 11.4 g/dL — ABNORMAL LOW (ref 12.0–15.0)
MCH: 31 pg (ref 26.0–34.0)
MCHC: 34.8 g/dL (ref 30.0–36.0)
MCV: 89.1 fL (ref 78.0–100.0)
PLATELETS: 217 10*3/uL (ref 150–400)
RBC: 3.68 MIL/uL — AB (ref 3.87–5.11)
RDW: 13.1 % (ref 11.5–15.5)
WBC: 15.3 10*3/uL — ABNORMAL HIGH (ref 4.0–10.5)

## 2016-11-18 NOTE — Discharge Summary (Signed)
Obstetric Discharge Summary Reason for Admission: induction of labor Prenatal Procedures: none Intrapartum Procedures: spontaneous vaginal delivery Postpartum Procedures: none Complications-Operative and Postpartum: none Hemoglobin  Date Value Ref Range Status  11/18/2016 11.4 (L) 12.0 - 15.0 g/dL Final   HCT  Date Value Ref Range Status  11/18/2016 32.8 (L) 36.0 - 46.0 % Final    Physical Exam:  General: alert, cooperative and appears stated age 17Lochia: appropriate Uterine Fundus: firm DVT Evaluation: No evidence of DVT seen on physical exam.  Discharge Diagnoses: Post-date pregnancy  Discharge Information: Date: 11/19/2016 Activity: pelvic rest Diet: routine Medications: PNV and Ibuprofen Condition: stable Instructions: refer to practice specific booklet Discharge to: home Follow-up Information    Marlow Baarslark, Elease Swarm, MD Follow up in 4 week(s).   Specialty:  Obstetrics Contact information: [address] [place] [address] [phone]           Newborn Data: Live born female  Birth Weight: 7 lb 1.9 oz (3230 g) APGAR: 9, 9  Home with mother.  Ryson Bacha GEFFEL Jamare Vanatta 11/19/2016, 8:44 AM

## 2016-11-18 NOTE — Progress Notes (Signed)
Patient is doing well.  She is ambulating, voiding, tolerating PO.  Pain control is good.  Lochia is appropriate  Vitals:   11/17/16 1530 11/17/16 1645 11/17/16 2026 11/18/16 0521  BP: 120/72 (!) 104/59 (!) 107/59 (!) 103/45  Pulse: (!) 58 (!) 58 (!) 57 60  Resp: 20 18 18 18   Temp: (!) 97.5 F (36.4 C) 98 F (36.7 C) 97.6 F (36.4 C) 98 F (36.7 C)  TempSrc: Oral Oral Axillary Oral  SpO2: 100% 99% 98%   Weight:      Height:        NAD Fundus firm Ext: no edema  Lab Results  Component Value Date   WBC 15.3 (H) 11/18/2016   HGB 11.4 (L) 11/18/2016   HCT 32.8 (L) 11/18/2016   MCV 89.1 11/18/2016   PLT 217 11/18/2016    --/--/O POS (07/06 0735)/RImmune  A/P 35 y.o. [id number] PPD#1. Routine care.   Expect d/c tomorrow.    [facility]NA GEFFEL [employer] Tim[employer] CompanyCLARK

## 2016-11-19 MED ORDER — IBUPROFEN 600 MG PO TABS: 600.0000 mg | ORAL_TABLET | Freq: Four times a day (QID) | ORAL | 0 refills | Status: DC

## 2016-11-19 NOTE — Progress Notes (Signed)
CSW received consult due to score greater than 9, or positive for SI on [place] Depression Screen.    CSW provided education regarding Baby Blues vs PMADs and provided MOB with information [facility]  CSW encouraged MOB to evaluate her mental health throughout the postpartum period with the use of the New Mom Checklist developed by Postpartum Progress and notify a medical professional if symptoms arise.    MOB acknowledged PPD signs and symptoms after 2nd baby and feels comfortable asking for help.  MOB denied SI and HI and reported feeling overall happy.  MOB has a wealth of support and feels prepared to parent.   There are no barriers to d/c.  Blaine HamperAngel Boyd-Gilyard, MSW, LCSW Clinical Social Work [phone]

## 2016-11-19 NOTE — Lactation Note (Signed)
This note was copied from a baby's chart. Lactation Consultation Note  Patient Name: Girl Shiza Heaton ZOXWR'UToday's Date: 11/19/2016 Reason for consult: Follow-up assessment Infant is 4246 hours old & [facility]. Baby was asleep when LC entered. Mom reports BF is going well and since this is not her first child, she has experience BF. Mom reports some nipple soreness. Mom reports the soreness is throughout the feeding and a little afterwards as well. Discussed importance of a good latch and what to look for as well as using the coconut oil after BF and letting her nipples air dry. Encouraged mom to call o/p number or come to support group if her nipple soreness does not improve in the next few days. Mom reports her breasts are starting to feel more full. Discussed engorgement prevention & treatment. Mom encouraged to feed baby 8-12 times/24 hours and with feeding cues.  Mom reports she is going back to work ~6wks and has her own pump. Suggested mom start to pump & store milk a few weeks before returning to work. Mom reports no questions at this time. Reminded mom of LC o/p number if she has any once home.  Maternal Data    Feeding Feeding Type: Breast Fed Length of feed: 20 min  LATCH Score/Interventions Latch: Grasps breast easily, tongue down, lips flanged, rhythmical sucking.  Audible Swallowing: Spontaneous and intermittent  Type of Nipple: Everted at rest and after stimulation  Comfort (Breast/Nipple): Filling, red/small blisters or bruises, mild/mod discomfort  Problem noted: Mild/Moderate discomfort  Hold (Positioning): No assistance needed to correctly position infant at breast.  LATCH Score: 9  Lactation Tools Discussed/Used     Consult Status Consult Status: Complete    Oneal GroutLaura C Kadi Hession 11/19/2016, 11:40 AM

## 2016-11-19 NOTE — Progress Notes (Signed)
Patient is doing well.  She is ambulating, voiding, tolerating PO.  Pain control is good.  Lochia is appropriate  Vitals:   11/17/16 2026 11/18/16 0521 11/18/16 2028 11/19/16 0623  BP: (!) 107/59 (!) 103/45 112/77 120/73  Pulse: (!) 57 60 (!) 58 67  Resp: 18 18 18 18   Temp: 97.6 F (36.4 C) 98 F (36.7 C) 98.3 F (36.8 C) 97.9 F (36.6 C)  TempSrc: Axillary Oral Oral Oral  SpO2: 98%     Weight:      Height:        NAD Fundus firm Ext: no edema  Lab Results  Component Value Date   WBC 15.3 (H) 11/18/2016   HGB 11.4 (L) 11/18/2016   HCT 32.8 (L) 11/18/2016   MCV 89.1 11/18/2016   PLT 217 11/18/2016    --/--/O POS (07/06 0735)/RImmune  A/P 35 y.o. [id number] PPD#2. Routine care.   HepB on viread Meeting all goals--d/c today    [facility] GEFFEL Chestine SporeLARK

## 2018-12-12 ENCOUNTER — Other Ambulatory Visit: Payer: Self-pay

## 2018-12-12 ENCOUNTER — Encounter: Payer: Self-pay | Admitting: Physician Assistant

## 2018-12-12 ENCOUNTER — Ambulatory Visit (INDEPENDENT_AMBULATORY_CARE_PROVIDER_SITE_OTHER): Payer: Managed Care, Other (non HMO) | Admitting: Physician Assistant

## 2018-12-12 VITALS — BP 111/77 | HR 65 | Temp 98.4°F | Ht 62.5 in | Wt 139.4 lb

## 2018-12-12 DIAGNOSIS — Z7689 Persons encountering health services in other specified circumstances: Secondary | ICD-10-CM

## 2018-12-12 DIAGNOSIS — Z130 Encounter for screening for diseases of the blood and blood-forming organs and certain disorders involving the immune mechanism: Secondary | ICD-10-CM | POA: Diagnosis not present

## 2018-12-12 DIAGNOSIS — Z1389 Encounter for screening for other disorder: Secondary | ICD-10-CM

## 2018-12-12 DIAGNOSIS — Z131 Encounter for screening for diabetes mellitus: Secondary | ICD-10-CM

## 2018-12-12 DIAGNOSIS — Z020 Encounter for examination for admission to educational institution: Secondary | ICD-10-CM | POA: Diagnosis not present

## 2018-12-12 DIAGNOSIS — Z13220 Encounter for screening for lipoid disorders: Secondary | ICD-10-CM | POA: Diagnosis not present

## 2018-12-12 DIAGNOSIS — B181 Chronic viral hepatitis B without delta-agent: Secondary | ICD-10-CM

## 2018-12-12 DIAGNOSIS — N96 Recurrent pregnancy loss: Secondary | ICD-10-CM | POA: Insufficient documentation

## 2018-12-12 NOTE — Patient Instructions (Addendum)
Schedule follow-up appt with Gastroenterology for surveillance of hepatitis B You were due October 2019 for your 6 month follow-up   Preventive Care 3-37 Years Old, Female Preventive care refers to visits with your health care provider and lifestyle choices that can promote health and wellness. This includes:  A yearly physical exam. This may also be called an annual well check.  Regular dental visits and eye exams.  Immunizations.  Screening for certain conditions.  Healthy lifestyle choices, such as eating a healthy diet, getting regular exercise, not using drugs or products that contain nicotine and tobacco, and limiting alcohol use. What can I expect for my preventive care visit? Physical exam Your health care provider will check your:  Height and weight. This may be used to calculate body mass index (BMI), which tells if you are at a healthy weight.  Heart rate and blood pressure.  Skin for abnormal spots. Counseling Your health care provider may ask you questions about your:  Alcohol, tobacco, and drug use.  Emotional well-being.  Home and relationship well-being.  Sexual activity.  Eating habits.  Work and work Statistician.  Method of birth control.  Menstrual cycle.  Pregnancy history. What immunizations do I need?  Influenza (flu) vaccine  This is recommended every year. Tetanus, diphtheria, and pertussis (Tdap) vaccine  You may need a Td booster every 10 years. Varicella (chickenpox) vaccine  You may need this if you have not been vaccinated. Human papillomavirus (HPV) vaccine  If recommended by your health care provider, you may need three doses over 6 months. Measles, mumps, and rubella (MMR) vaccine  You may need at least one dose of MMR. You may also need a second dose. Meningococcal conjugate (MenACWY) vaccine  One dose is recommended if you are age 41-21 years and a first-year college student living in a residence hall, or if you have  one of several medical conditions. You may also need additional booster doses. Pneumococcal conjugate (PCV13) vaccine  You may need this if you have certain conditions and were not previously vaccinated. Pneumococcal polysaccharide (PPSV23) vaccine  You may need one or two doses if you smoke cigarettes or if you have certain conditions. Hepatitis A vaccine  You may need this if you have certain conditions or if you travel or work in places where you may be exposed to hepatitis A. Hepatitis B vaccine  You may need this if you have certain conditions or if you travel or work in places where you may be exposed to hepatitis B. Haemophilus influenzae type b (Hib) vaccine  You may need this if you have certain conditions. You may receive vaccines as individual doses or as more than one vaccine together in one shot (combination vaccines). Talk with your health care provider about the risks and benefits of combination vaccines. What tests do I need?  Blood tests  Lipid and cholesterol levels. These may be checked every 5 years starting at age 66.  Hepatitis C test.  Hepatitis B test. Screening  Diabetes screening. This is done by checking your blood sugar (glucose) after you have not eaten for a while (fasting).  Sexually transmitted disease (STD) testing.  BRCA-related cancer screening. This may be done if you have a family history of breast, ovarian, tubal, or peritoneal cancers.  Pelvic exam and Pap test. This may be done every 3 years starting at age 77. Starting at age 10, this may be done every 5 years if you have a Pap test in combination with an  HPV test. Talk with your health care provider about your test results, treatment options, and if necessary, the need for more tests. Follow these instructions at home: Eating and drinking   Eat a diet that includes fresh fruits and vegetables, whole grains, lean protein, and low-fat dairy.  Take vitamin and mineral supplements as  recommended by your health care provider.  Do not drink alcohol if: ? Your health care provider tells you not to drink. ? You are pregnant, may be pregnant, or are planning to become pregnant.  If you drink alcohol: ? Limit how much you have to 0-1 drink a day. ? Be aware of how much alcohol is in your drink. In the [place] one drink equals one 12 oz bottle of beer (355 mL), one 5 oz glass of wine (148 mL), or one 1 oz glass of hard liquor (44 mL). Lifestyle  Take daily care of your teeth and gums.  Stay active. Exercise for at least 30 minutes on 5 or more days each week.  Do not use any products that contain nicotine or tobacco, such as cigarettes, e-cigarettes, and chewing tobacco. If you need help quitting, ask your health care provider.  If you are sexually active, practice safe sex. Use a condom or other form of birth control (contraception) in order to prevent pregnancy and STIs (sexually transmitted infections). If you plan to become pregnant, see your health care provider for a preconception visit. What's next?  Visit your health care provider once a year for a well check visit.  Ask your health care provider how often you should have your eyes and teeth checked.  Stay up to date on all vaccines. This information is not intended to replace advice given to you by your health care provider. Make sure you discuss any questions you have with your health care provider. Document Released: 06/27/2001 Document Revised: 01/10/2018 Document Reviewed: 01/10/2018 Elsevier Patient Education  2020 [employer].

## 2018-12-12 NOTE — Progress Notes (Signed)
HPI:                                                                Jennifer Adams is a 38 y.o. female who [facility] today to establish care  Current concerns:  Chronic Hep B: followed by [facility]. She is treated with Viread 300 mg daily. Last OV April 2019. She is overdue for 6 month follow-up   [facility]/12/2018  Decreased Interest 0  Down, Depressed, Hopeless 0  PHQ - 2 Score 0  Altered sleeping 0  Tired, decreased energy 0  Change in appetite 0  Feeling bad or failure about yourself  0  Trouble concentrating 0  Moving slowly or fidgety/restless 0  Suicidal thoughts 0  PHQ-9 Score 0  Difficult doing work/chores Not difficult at all    GAD 7 : Generalized Anxiety Score 12/12/2018  Nervous, Anxious, on Edge 0  Control/stop worrying 0  Worry too much - different things 0  Trouble relaxing 0  Restless 0  Easily annoyed or irritable 0  Afraid - awful might happen 0  Total GAD 7 Score 0  Anxiety Difficulty Not difficult at all    Fall Risk  12/12/2018 10/19/2016  Falls in the past year? 0 No     Past Medical History:  Diagnosis Date  . H/O varicella   . Hepatitis    hepatitis B  . Hepatitis B   . History of multiple miscarriages   . No pertinent past medical history   . Trisomy 18 in child of prior pregnancy, currently pregnant    Past Surgical History:  Procedure Laterality Date  . DILATION AND CURETTAGE OF UTERUS     Social History   Tobacco Use  . Smoking status: Never Smoker  . Smokeless tobacco: Never Used  Substance Use Topics  . Alcohol use: No   family history includes Cancer in her maternal uncle; Hypertension in her father; Tongue cancer in her paternal uncle.    ROS: negative except as noted in the HPI  Medications: Current Outpatient Medications  Medication Sig Dispense Refill  . Prenatal Vit-Fe Fumarate-FA (PRENATAL MULTIVITAMIN) TABS tablet Take 1 tablet  by mouth daily at 12 noon.    Marland Kitchen. VIREAD 300 MG tablet Take 1 tablet by mouth daily.     No current facility-administered medications for this visit.    No Known Allergies     Objective:  BP 111/77   Pulse 65   Temp 98.4 F (36.9 C) (Oral)   Ht 5' 2.5" (1.588 m)   Wt 139 lb 6.4 oz (63.2 kg)   LMP 11/22/2018 (Exact Date)   BMI 25.09 kg/m   Wt Readings from Last 3 Encounters:  12/12/18 139 lb 6.4 oz (63.2 kg)  11/17/16 170 lb (77.1 kg)  10/19/16 171 lb 3.2 oz (77.7 kg)   Temp Readings from Last 3 Encounters:  12/12/18 98.4 F (36.9 C) (Oral)  11/19/16 97.9 F (36.6 C) (Oral)  10/23/14 98.1 F (36.7 C) (Oral)   BP Readings from Last 3 Encounters:  12/12/18 111/77  11/19/16 120/73  10/19/16 (!) 104/59   Pulse Readings from Last 3 Encounters:  12/12/18 65  11/19/16 67  10/19/16 72  General Appearance:  Alert, cooperative, no distress, appropriate for age                            Head:  Normocephalic, without obvious abnormality                             Eyes:  PERRL, EOM's intact with horizontal nystagmus, conjunctiva and cornea clear, visual acuity 20/20 with correction                             Ears:  TM pearly gray color and semitransparent, external ear canals normal, both ears                                                      Neck:  Supple; symmetrical, trachea midline, no adenopathy; thyroid: no enlargement, symmetric, no tenderness/mass/nodules                             Back:  Symmetrical, no curvature, ROM normal               Chest/Breast: deferred                           Lungs:  Clear to auscultation bilaterally, respirations unlabored                             Heart:  normal rate & regular rhythm, S1 and S2 normal, no murmurs, rubs, or gallops                     Abdomen:  Soft, non-tender, no mass or organomegaly              Genitourinary:  deferred         Musculoskeletal:  Tone and strength strong and symmetrical, all extremities; no  joint pain or edema, normal gait and station                                      Lymphatic:  No adenopathy             Skin/Hair/Nails:  Skin warm, dry and intact, no rashes or abnormal dyspigmentation on limited exam                   Neurologic:  Alert and oriented x3, no cranial nerve deficits, sensation grossly intact, normal gait and station, no tremor Psych: well-groomed, cooperative, good eye contact, euthymic mood, affect mood-congruent, speech is articulate, and thought processes clear and goal-directed  Acute Interface, Incoming Rad Results - 09/06/2017  1:18 PM EDT TECHNIQUE: Right upper quadrant ultrasound  COMPARISON: None.    INDICATION: Chronic viral hepatitis B without delta-agent (#)    FINDINGS:  Vasculature: - No abdominal aortic aneurysm. - Visualized IVC is patent. - Portal vein is patent with normal flow direction.  Pancreas:  - No focal abnormalities are identified. - Visualization is limited.  Hepatobiliary: - No focal liver abnormalities are identified.    -  Probable small gallbladder polyps. No evidence of gallstones. - CBD size is within normal limits. - No intrahepatic biliary ductal dilatation.  Kidneys: - Right kidney measures 11.4 cm. - No hydronephrosis. - No suspicious masses. - Normal renal echotexture.   Additional/incidental:  - N./A.   IMPRESSION: No acute findings  Electronically Signed by: Dairl Ponder  No results found for this or any previous visit (from the past 59 hour(s)). No results found.    Assessment and Plan: 38 y.o. female with   .Jennifer Adams was seen today for new patient (initial visit).  Diagnoses and all orders for this visit:  Encounter for school history and physical examination -     CBC -     COMPLETE METABOLIC PANEL WITH GFR -     Lipid Panel w/reflex Direct LDL -     Urinalysis, Routine w reflex microscopic  Encounter to establish care  Screening for blood disease -     CBC  Screening for  lipid disorders -     Lipid Panel w/reflex Direct LDL  Screening for diabetes mellitus -     COMPLETE METABOLIC PANEL WITH GFR  History of multiple miscarriages  Chronic hepatitis B (HCC)  Screening for blood or protein in urine -     Urinalysis, Routine w reflex microscopic   - Personally reviewed PMH, PSH, PFH, medications, allergies, HM - Personally reviewed outside records in [place] including [place] report from 2019 - Age-appropriate cancer screening: Pap UTD 2018 per patient, requested record from [place] reviewed and UTD - PHQ2 negative - Fall screen negative - Routine fasting labs pending - Physical exam form completed, signed and returned to patient    Patient education and anticipatory guidance given Patient agrees with treatment plan Follow-up in 5-6 months for pelvic/breast exam and TB screen as needed if symptoms worsen or fail to improve  Darlyne Russian PA-C

## 2018-12-13 LAB — URINALYSIS, ROUTINE W REFLEX MICROSCOPIC
Bacteria, UA: NONE SEEN /HPF
Bilirubin Urine: NEGATIVE
Glucose, UA: NEGATIVE
Hgb urine dipstick: NEGATIVE
Hyaline Cast: NONE SEEN /LPF
Nitrite: NEGATIVE
Protein, ur: NEGATIVE
RBC / HPF: NONE SEEN /HPF (ref 0–2)
Specific Gravity, Urine: 1.004 (ref 1.001–1.03)
pH: 6.5 (ref 5.0–8.0)

## 2018-12-13 LAB — COMPLETE METABOLIC PANEL WITH GFR
AG Ratio: 1.8 (calc) (ref 1.0–2.5)
ALT: 23 U/L (ref 6–29)
AST: 20 U/L (ref 10–30)
Albumin: 4.8 g/dL (ref 3.6–5.1)
Alkaline phosphatase (APISO): 41 U/L (ref 31–125)
BUN: 10 mg/dL (ref 7–25)
CO2: 24 mmol/L (ref 20–32)
Calcium: 9.5 mg/dL (ref 8.6–10.2)
Chloride: 104 mmol/L (ref 98–110)
Creat: 0.59 mg/dL (ref 0.50–1.10)
GFR, Est African American: 137 mL/min/{1.73_m2} (ref >=60)
GFR, Est Non African American: 118 mL/min/{1.73_m2} (ref >=60)
Globulin: 2.7 g/dL (calc) (ref 1.9–3.7)
Glucose, Bld: 81 mg/dL (ref 65–99)
Potassium: 4.1 mmol/L (ref 3.5–5.3)
Sodium: 138 mmol/L (ref 135–146)
Total Bilirubin: 1.1 mg/dL (ref 0.2–1.2)
Total Protein: 7.5 g/dL (ref 6.1–8.1)

## 2018-12-13 LAB — CBC
HCT: 40.8 % (ref 35.0–45.0)
Hemoglobin: 13.5 g/dL (ref 11.7–15.5)
MCH: 29.3 pg (ref 27.0–33.0)
MCHC: 33.1 g/dL (ref 32.0–36.0)
MCV: 88.5 fL (ref 80.0–100.0)
MPV: 9 fL (ref 7.5–12.5)
Platelets: 325 10*3/uL (ref 140–400)
RBC: 4.61 10*6/uL (ref 3.80–5.10)
RDW: 11.5 % (ref 11.0–15.0)
WBC: 5.3 10*3/uL (ref 3.8–10.8)

## 2018-12-13 LAB — LIPID PANEL W/REFLEX DIRECT LDL
Cholesterol: 176 mg/dL (ref <200)
HDL: 57 mg/dL (ref >=50)
LDL Cholesterol (Calc): 104 mg/dL (calc) — ABNORMAL HIGH
Non-HDL Cholesterol (Calc): 119 mg/dL (calc) (ref <130)
Total CHOL/HDL Ratio: 3.1 (calc) (ref <5.0)
Triglycerides: 60 mg/dL (ref <150)

## 2018-12-16 ENCOUNTER — Encounter: Payer: Self-pay | Admitting: Physician Assistant

## 2018-12-16 DIAGNOSIS — R82998 Other abnormal findings in urine: Secondary | ICD-10-CM | POA: Insufficient documentation

## 2019-02-03 ENCOUNTER — Ambulatory Visit (INDEPENDENT_AMBULATORY_CARE_PROVIDER_SITE_OTHER): Payer: Managed Care, Other (non HMO) | Admitting: Physician Assistant

## 2019-02-03 ENCOUNTER — Other Ambulatory Visit: Payer: Self-pay

## 2019-02-03 ENCOUNTER — Encounter: Payer: Self-pay | Admitting: Physician Assistant

## 2019-02-03 VITALS — BP 124/72 | HR 68 | Ht 63.0 in | Wt 144.0 lb

## 2019-02-03 DIAGNOSIS — B181 Chronic viral hepatitis B without delta-agent: Secondary | ICD-10-CM

## 2019-02-03 DIAGNOSIS — Z0184 Encounter for antibody response examination: Secondary | ICD-10-CM | POA: Diagnosis not present

## 2019-02-03 DIAGNOSIS — Z23 Encounter for immunization: Secondary | ICD-10-CM | POA: Diagnosis not present

## 2019-02-03 NOTE — Progress Notes (Signed)
l °

## 2019-02-03 NOTE — Progress Notes (Signed)
   Subjective:    Patient ID: Jennifer Adams, female    DOB: Aziya 20, 1982, 38 y.o.   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   She is still needing 2nd dose of varicella. Per patient she had titer drawn in 2011 and was positive for immunity. She does not have form that states this.   She sees GI for hep B. She wonders if school will have problems with this dx.   .. Active Ambulatory Problems    Diagnosis Date Noted  . Hepatitis B 04/02/2012  . History of multiple miscarriages    Resolved Ambulatory Problems    Diagnosis Date Noted  . Active labor at term 10/21/2014  . Spontaneous vaginal delivery 10/22/2014  . Post term pregnancy at [redacted] weeks gestation 11/17/2016  . Leukocytes in urine 12/16/2018   Past Medical History:  Diagnosis Date  . H/O varicella   . Hepatitis   . No pertinent past medical history   . Trisomy 18 in child of prior pregnancy, currently pregnant       Review of Systems  All other systems reviewed and are negative.      Objective:   Physical Exam Vitals signs reviewed.  Constitutional:      Appearance: Normal appearance.  Cardiovascular:     Rate and Rhythm: Normal rate and regular rhythm.     Pulses: Normal pulses.  Pulmonary:     Effort: Pulmonary effort is normal.     Breath sounds: Normal breath sounds.  Neurological:     General: No focal deficit present.     Mental Status: She is alert and oriented to person, place, and time.  Psychiatric:        Mood and Affect: Mood normal.           Assessment & Plan:  .Marland KitchenJun was seen today for form completion.  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  Needs TB  screening. Per patient will wait until January when she will enter school.  Flu shot given today.  Contact GI for letter about hep B status. I can also give letter if needed.

## 2019-02-04 LAB — VARICELLA ZOSTER ANTIBODY, IGG: Varicella IgG: 1483 index

## 2019-02-04 NOTE — Progress Notes (Signed)
Pt has immunity. Please print and I will sign and patient can pick up for records.

## 2019-03-27 IMAGING — US US MFM OB DETAIL+14 WK
1 series · 13 of 28 positions shown · non-contrast
Comparison: none

[Series 1: us mfm ob detail+14 wk · 71 acquisitions, 13 frames shown]
[im 3/71]
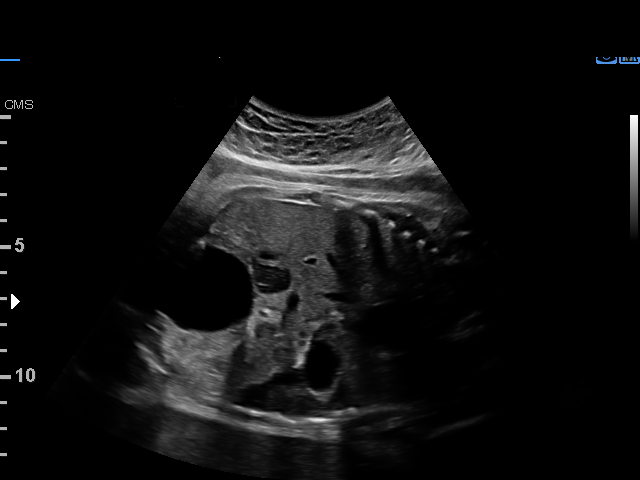
[im 8/71]
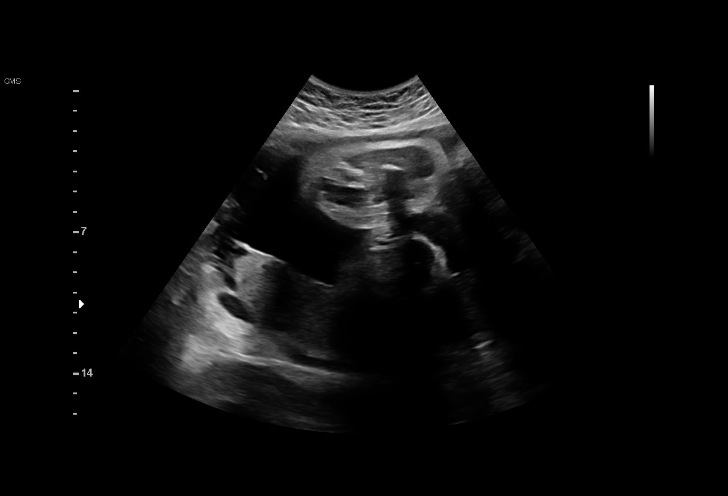
[im 13/71]
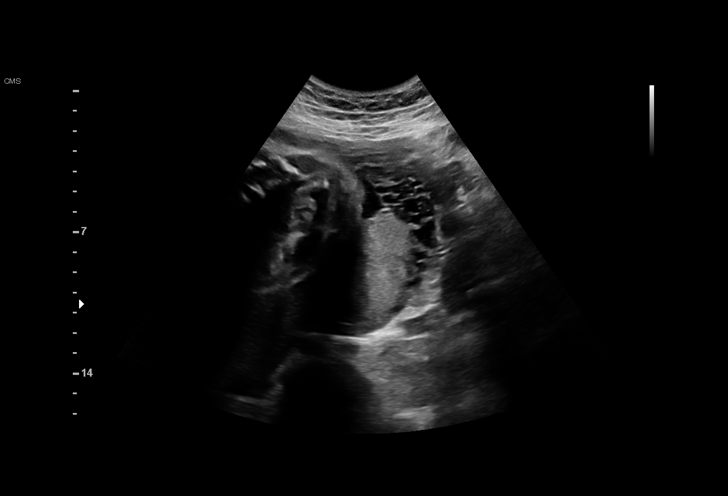
[im 19/71]
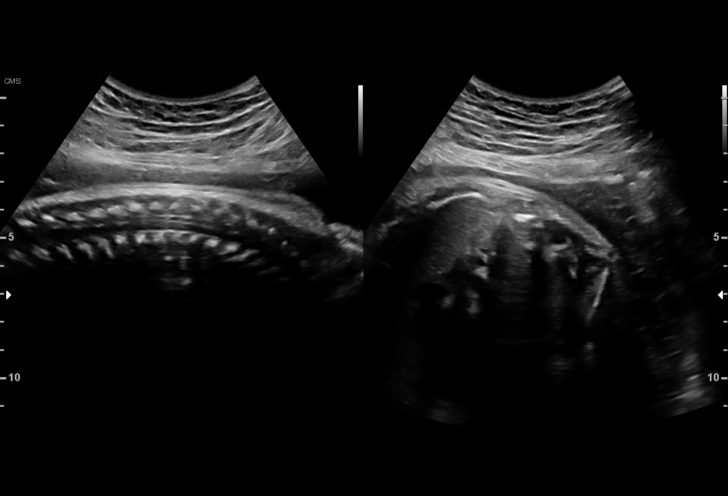
[im 24/71]
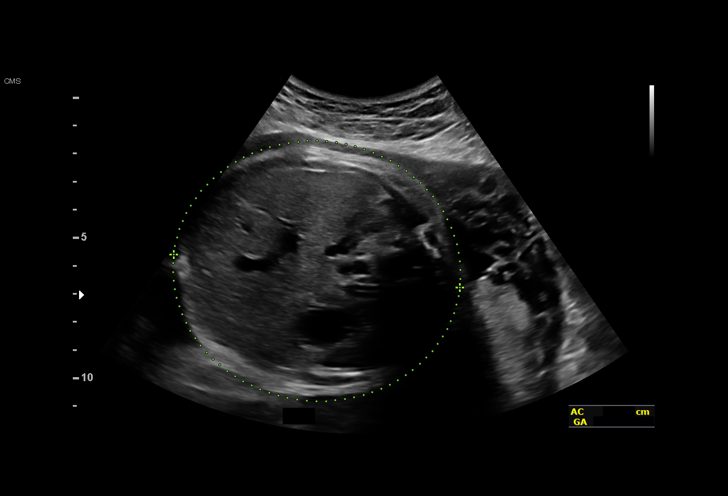
[im 29/71]
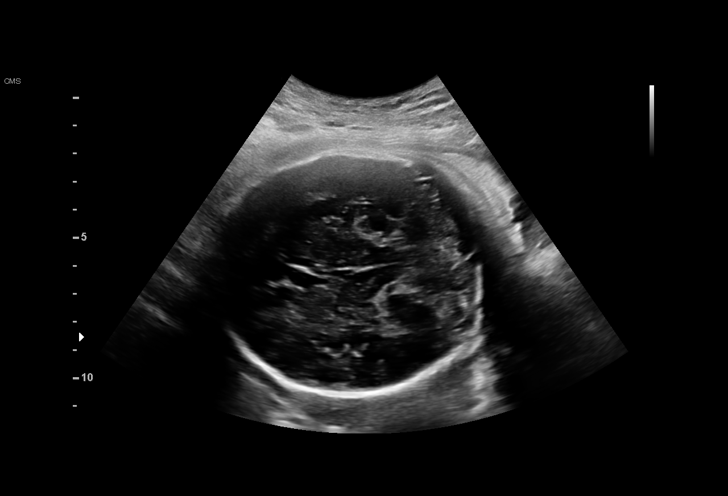
[im 37/71]
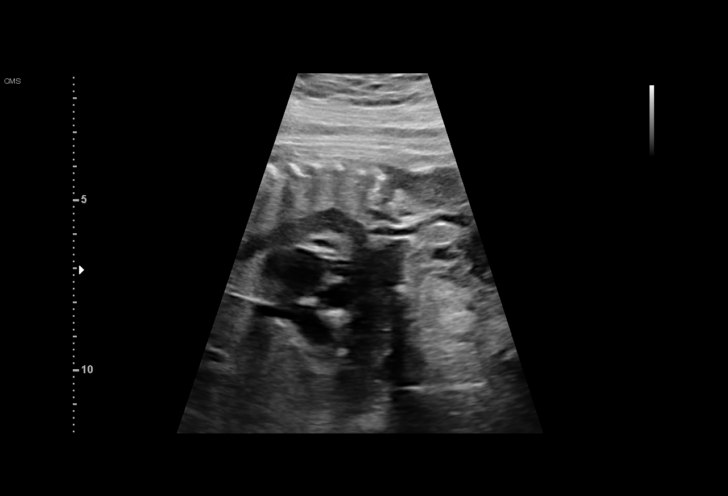
[im 42/71]
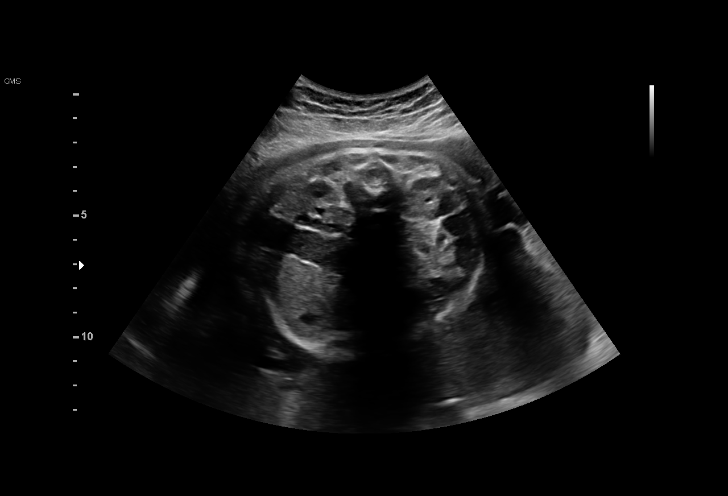
[im 47/71]
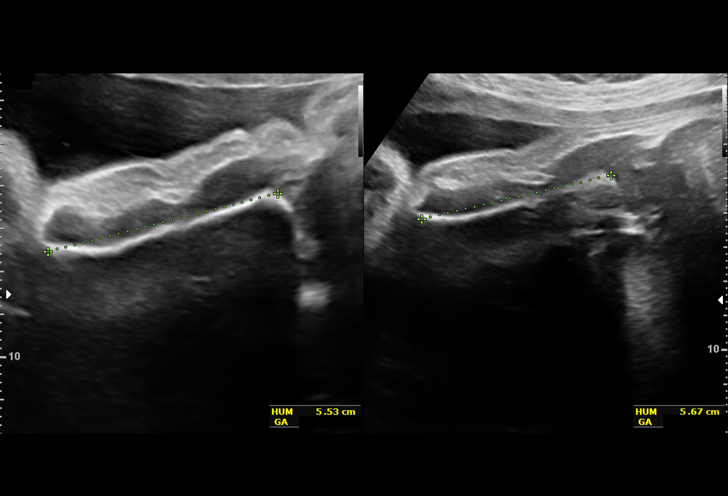
[im 52/71]
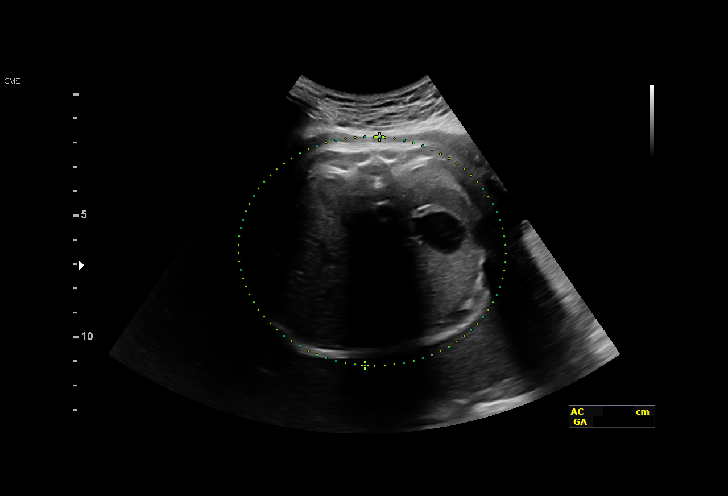
[im 58/71]
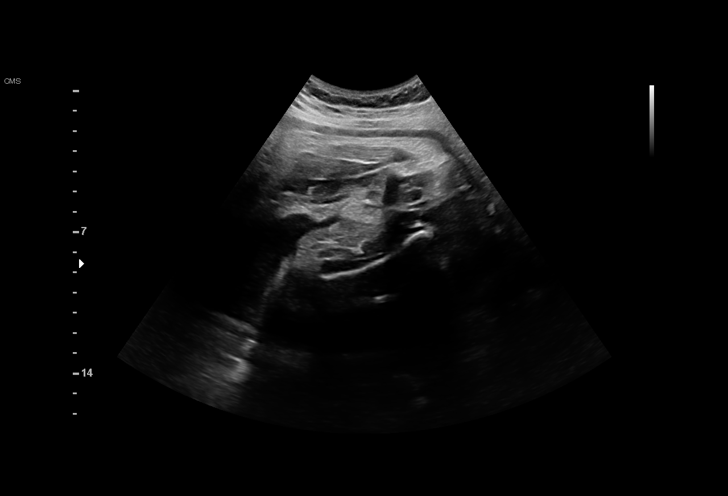
[im 63/71]
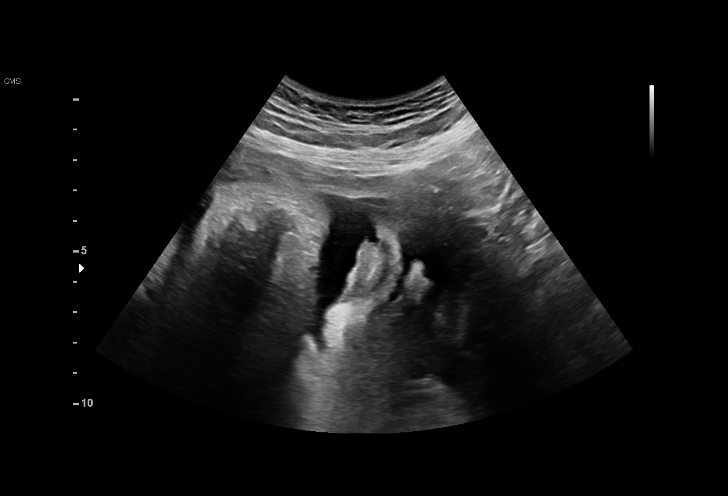
[im 68/71]
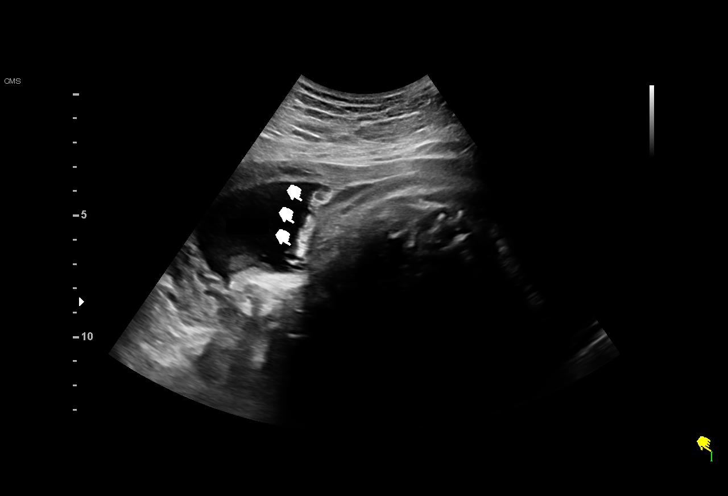

[13 of 28 positions shown; findings below may reference images not displayed]

ZUKEYAMA DO

1  MAINOR ASSIS          [id number]      [id number]     [id number]
Indications

36 weeks gestation of pregnancy
Encounter for fetal anatomic survey (low risk
NIPS)
Uterine size-date discrepancy, third trimester
Hepatitis B complicating pregnancy             O98.419,
Previous pregnancy complicated by
chromosomal abnormality, antepartum
OB History

Gravidity:    5         Term:   2        Prem:   0        SAB:   1
TOP:          1       Ectopic:  0        Living: 2
Fetal Evaluation

Num Of Fetuses:     1
Fetal Heart         148
Rate(bpm):
Cardiac Activity:   Observed
Presentation:       Cephalic
Placenta:           Posterior, above cervical os
P. Cord Insertion:  Not well visualized

Amniotic Fluid
AFI FV:      Subjectively within normal limits

AFI Sum(cm)     %Tile       Largest Pocket(cm)
13.38           49

RUQ(cm)       RLQ(cm)       LUQ(cm)        LLQ(cm)
4.14
Biometry

BPD:      89.2  mm     G. Age:  36w 1d         42  %    CI:        76.13   %    70 - 86
FL/HC:      20.4   %    20.8 -
HC:       324   mm     G. Age:  36w 5d         21  %    HC/AC:      1.03        0.92 -
AC:      314.9  mm     G. Age:  35w 3d         23  %    FL/BPD:     74.1   %    71 - 87
FL:       66.1  mm     G. Age:  34w 0d        < 3  %    FL/AC:      21.0   %    20 - 24
HUM:        56  mm     G. Age:  32w 4d        < 5  %

Est. FW:    8296  gm    5 lb 13 oz      31  %
Gestational Age

LMP:           36w 6d        Date:  02/04/16                 EDD:   11/10/16
U/S Today:     35w 4d                                        EDD:   11/19/16
Best:          36w 6d     Det. By:  LMP  (02/04/16)          EDD:   11/10/16
Anatomy

Cranium:               Appears normal         Aortic Arch:            Appears normal
Cavum:                 Appears normal         Ductal Arch:            Not well visualized
Ventricles:            Appears normal         Diaphragm:              Not well visualized
Choroid Plexus:        Right choroid          Stomach:                Appears normal, left
plexus cyst
sided
Cerebellum:            Appears normal         Abdomen:                Appears normal
Posterior Fossa:       Appears normal         Abdominal Wall:         Not well visualized
Nuchal Fold:           Not applicable (>20    Cord Vessels:           Not well visualized
wks GA)
Face:                  Not well visualized    Kidneys:                Appear normal
Lips:                  Appears normal         Bladder:                Appears normal
Thoracic:              Appears normal         Spine:                  Appears normal
Heart:                 Appears normal         Upper Extremities:      Visualized
(4CH, axis, and
situs)
RVOT:                  Appears normal         Lower Extremities:      Visualized
LVOT:                  Appears normal
Cervix Uterus Adnexa

Cervix
Not visualized (advanced GA >12wks)

Uterus
No abnormality visualized.

Left Ovary
Not visualized.

Right Ovary
Within normal limits.

Cul De Sac:   No free fluid seen.

Adnexa:       No abnormality visualized.
Impression

Singleton intrauterine pregnancy at 36+6 weeks, with
suspected IUGR. Patient has chronic active hepatitis B and is
on Viread
Review of the anatomy shows the presence of a right choroid
plexus cyst. Otherwise there are no sonographic markers for
aneuploidy or structural anomalies
However, evaluation should be considered suboptimal
secondary to late EGA
Amniotic fluid volume is normal with an AFI of 13.4 cm
Estimated fetal weight is 2631g which is growth in the 31st
percentile. the abdominal circumfrence is in the 23rd
percentile
Recommendations

There is no intervention needed for the choroid plexus cyst
given the late EGA and the negative NIPT
The growth of the baby, while at a lower rate than her
previous children, is well within normal limits. no interventions
are necessary at this time.
formal consultation was not performed, but I discussed the
fetal growth ssues at length with the patient and her partner
Follow-up ultrasounds as clinically indicated.

## 2019-04-30 ENCOUNTER — Ambulatory Visit: Payer: Managed Care, Other (non HMO) | Admitting: Physician Assistant

## 2019-04-30 ENCOUNTER — Ambulatory Visit: Payer: Managed Care, Other (non HMO) | Admitting: Osteopathic Medicine

## 2019-05-06 ENCOUNTER — Ambulatory Visit (INDEPENDENT_AMBULATORY_CARE_PROVIDER_SITE_OTHER): Payer: Managed Care, Other (non HMO) | Admitting: Osteopathic Medicine

## 2019-05-06 ENCOUNTER — Encounter: Payer: Self-pay | Admitting: Osteopathic Medicine

## 2019-05-06 ENCOUNTER — Other Ambulatory Visit (HOSPITAL_COMMUNITY)
Admission: RE | Admit: 2019-05-06 | Discharge: 2019-05-06 | Disposition: A | Discharge status: Home / Self Care | Payer: Managed Care, Other (non HMO) | Source: Ambulatory Visit | Attending: Osteopathic Medicine | Admitting: Osteopathic Medicine

## 2019-05-06 ENCOUNTER — Other Ambulatory Visit: Payer: Self-pay

## 2019-05-06 VITALS — BP 125/80 | HR 69 | Temp 98.0°F | Wt 146.1 lb

## 2019-05-06 DIAGNOSIS — R238 Other skin changes: Secondary | ICD-10-CM

## 2019-05-06 DIAGNOSIS — Z124 Encounter for screening for malignant neoplasm of cervix: Secondary | ICD-10-CM | POA: Diagnosis present

## 2019-05-06 DIAGNOSIS — Z111 Encounter for screening for respiratory tuberculosis: Secondary | ICD-10-CM

## 2019-05-06 DIAGNOSIS — Z0000 Encounter for general adult medical examination without abnormal findings: Secondary | ICD-10-CM

## 2019-05-06 MED ORDER — LIDOCAINE HCL 4.12 % EX CREA: 1.0000 "application " | TOPICAL_CREAM | Freq: Three times a day (TID) | CUTANEOUS | 1 refills | Status: DC | PRN

## 2019-05-06 MED ORDER — VALACYCLOVIR HCL 1 G PO TABS: 1000.0000 mg | ORAL_TABLET | Freq: Three times a day (TID) | ORAL | 0 refills | Status: AC

## 2019-05-06 NOTE — Progress Notes (Signed)
HPI: Jennifer Adams is a 38 y.o. female who  has a past medical history of H/O varicella, Hepatitis, Hepatitis B, History of multiple miscarriages, No pertinent past medical history, and Trisomy 30 in child of prior pregnancy, currently pregnant.  she presents to [facility] today, 05/06/19,  for chief complaint of: Pap / annual  Skin problem    Patient here for annual physical / wellness exam.  See preventive care reviewed as below.  Recent labs reviewed in detail with the patient.   Additional concerns today include:  Abdominal / Skin - Pain that comes and goes, feels like skin is sensitive, points to ribcage under her R breast/torso , no abdominal pain specifically, discomfort wraps around to back/ No pain on L       Past medical, surgical, social and family history reviewed:  Patient Active Problem List   Diagnosis Date Noted  . History of multiple miscarriages   . Hepatitis B 04/02/2012    Past Surgical History:  Procedure Laterality Date  . DILATION AND CURETTAGE OF UTERUS      Social History   Tobacco Use  . Smoking status: Never Smoker  . Smokeless tobacco: Never Used  Substance Use Topics  . Alcohol use: No    Family History  Problem Relation Age of Onset  . Hypertension Father   . Cancer Maternal Uncle   . Tongue cancer Paternal Uncle      Current medication list and allergy/intolerance information reviewed:    Current Outpatient Medications  Medication Sig Dispense Refill  . Prenatal Vit-Fe Fumarate-FA (PRENATAL MULTIVITAMIN) TABS tablet Take 1 tablet by mouth daily at 12 noon.    Marland Kitchen VIREAD 300 MG tablet Take 1 tablet by mouth daily.     No current facility-administered medications for this visit.    No Known Allergies    Review of Systems:  Constitutional:  No  fever, no chills, No recent illness  Cardiac: No  chest pain  Respiratory:  No  shortness of breath. No  Cough  Gastrointestinal: No  abdominal  pain  Musculoskeletal: No new myalgia/arthralgia  Skin: +Rash, No other wounds/concerning lesions  Genitourinary: No  incontinence, No  abnormal genital bleeding, No abnormal genital discharge  Neurologic: No  weakness, No  dizziness  Psychiatric: No  concerns with depression, No  concerns with anxiety  Exam:  BP 125/80 (BP Location: Left Arm, Patient Position: Sitting, Cuff Size: Normal)   Pulse 69   Temp 98 F (36.7 C) (Oral)   Wt 146 lb 1.3 oz (66.3 kg)   BMI 25.88 kg/m   Constitutional: VS see above. General Appearance: alert, well-developed, well-nourished, NAD  Neck: No masses, trachea midline. No thyroid enlargement. No tenderness/mass appreciated. No lymphadenopathy  Respiratory: Normal respiratory effort.   Gastrointestinal: Nontender, no masses. No hepatomegaly, no splenomegaly. No hernia appreciated. Bowel sounds normal. Rectal exam deferred.   Musculoskeletal: Gait normal. No clubbing/cyanosis of digits.   Neurological: Normal balance/coordination.No tremor.  Skin: warm, dry, intact. Faint maculopapular  Psychiatric: Normal judgment/insight. Normal mood and affect. Oriented x3.  GYN: No lesions/ulcers to external genitalia, normal urethra, normal vaginal mucosa, physiologic discharge, cervix normal without lesions, uterus not enlarged or tender, adnexa no masses and nontender  BREAST: No rashes/skin changes, normal fibrous breast tissue, no masses or tenderness, normal nipple without discharge, normal axilla     No results found for this or any previous visit (from the past 72 hour(s)).  No results found.  ASSESSMENT/PLAN: The primary encounter diagnosis was Annual physical exam. Diagnoses of Screening-pulmonary TB and Cervical cancer screening were also pertinent to this visit.   Orders Placed This Encounter  Procedures  . QuantiFERON-TB Gold Plus    Meds ordered this encounter  Medications  . valACYclovir (VALTREX) 1000 MG tablet    Sig:  Take 1 tablet (1,000 mg total) by mouth 3 (three) times daily for 7 days.    Dispense:  21 tablet    Refill:  0  . Lidocaine HCl 4.12 % CREA    Sig: Apply 1 application topically 3 (three) times daily as needed (skin pain).    Dispense:  28.3 g    Refill:  1    Patient Instructions  I think skin sensitivity might be an unusual case of shingles, let's try some anti-viral medications (valacyclovir aka Valtrex) and numbin skin cream, and see if this helps! If not, please call me!   General Preventive Care  Most recent routine screening lipids/other labs: done earlier this year and ok!   Everyone should have blood pressure checked once per year.   Tobacco: don't!   Alcohol: responsible moderation is ok for most adults - if you have concerns about your alcohol intake, please talk to me!   Exercise: as tolerated to reduce risk of cardiovascular disease and diabetes. Strength training will also prevent osteoporosis.   Mental health: if need for mental health care (medicines, counseling, other), or concerns about moods, please let me know!   Sexual health: if need for STD testing, or if concerns with libido/pain problems, please let me know! If you need to discuss your birth control options, please let me know!   Advanced Directive: Living Will and/or Healthcare Power of Attorney recommended for all adults, regardless of age or health.  Vaccines  Flu vaccine: recommended for almost everyone, every fall.   Shingles vaccine: Shingrix recommended after age 47.  Pneumonia vaccines: Prevnar and Pneumovax recommended after age 39, or sooner if certain medical conditions.  Tetanus booster: Tdap recommended every 10 years. Due 2028 Cancer screenings   Colon cancer screening: recommended for everyone at age 47  Breast cancer screening: mammogram recommended starting at age 43   Cervical cancer screening: Pap every 1 to 5 years depending on age and other risk factors.    Lung cancer  screening: not needed for non-smokers  Infection screenings . HIV, Gonorrhea/Chlamydia: screening as needed . TB: certain at-risk populations, or depending on work requirements and/or travel history . Hepatitis: follow-up with GI as directed.  Other . Bone Density Test: recommended for women at age 72        Visit summary with medication list and pertinent instructions was printed for patient to review. All questions at time of visit were answered - patient instructed to contact office with any additional concerns or updates. ER/RTC precautions were reviewed with the patient.     Please note: voice recognition software was used to produce this document, and typos may escape review. Please contact Dr. Lyn Hollingshead for any needed clarifications.     Follow-up plan: Return in about 1 year (around 05/05/2020) for [place] (call week prior to visit for lab orders), see [place] sooner if needed! Marland Kitchen

## 2019-05-06 NOTE — Patient Instructions (Addendum)
I think skin sensitivity might be an unusual case of shingles, let's try some anti-viral medications (valacyclovir aka Valtrex) and numbin skin cream, and see if this helps! If not, please call me!   General Preventive Care  Most recent routine screening lipids/other labs: done earlier this year and ok!   Everyone should have blood pressure checked once per year.   Tobacco: don't!   Alcohol: responsible moderation is ok for most adults - if you have concerns about your alcohol intake, please talk to me!   Exercise: as tolerated to reduce risk of cardiovascular disease and diabetes. Strength training will also prevent osteoporosis.   Mental health: if need for mental health care (medicines, counseling, other), or concerns about moods, please let me know!   Sexual health: if need for STD testing, or if concerns with libido/pain problems, please let me know! If you need to discuss your birth control options, please let me know!   Advanced Directive: Living Will and/or Healthcare Power of Attorney recommended for all adults, regardless of age or health.  Vaccines  Flu vaccine: recommended for almost everyone, every fall.   Shingles vaccine: Shingrix recommended after age 66.  Pneumonia vaccines: Prevnar and Pneumovax recommended after age 41, or sooner if certain medical conditions.  Tetanus booster: Tdap recommended every 10 years. Due 2028 Cancer screenings   Colon cancer screening: recommended for everyone at age 2  Breast cancer screening: mammogram recommended starting at age 47   Cervical cancer screening: Pap every 1 to 5 years depending on age and other risk factors.    Lung cancer screening: not needed for non-smokers  Infection screenings . HIV, Gonorrhea/Chlamydia: screening as needed . TB: certain at-risk populations, or depending on work requirements and/or travel history . Hepatitis: follow-up with GI as directed.  Other . Bone Density Test: recommended for women  at age 88

## 2019-05-07 LAB — CYTOLOGY - PAP
Comment: NEGATIVE
Diagnosis: NEGATIVE
High risk HPV: NEGATIVE

## 2019-05-08 LAB — QUANTIFERON-TB GOLD PLUS
Mitogen-NIL: >10 IU/mL
NIL: 0.03 IU/mL
QuantiFERON-TB Gold Plus: NEGATIVE
TB1-NIL: 0.01 IU/mL
TB2-NIL: 0.01 IU/mL

## 2019-08-16 ENCOUNTER — Telehealth: Payer: Managed Care, Other (non HMO)

## 2020-02-27 ENCOUNTER — Encounter: Payer: Self-pay | Admitting: Medical-Surgical

## 2020-02-27 ENCOUNTER — Other Ambulatory Visit: Payer: Self-pay

## 2020-02-27 ENCOUNTER — Ambulatory Visit (INDEPENDENT_AMBULATORY_CARE_PROVIDER_SITE_OTHER): Payer: No Typology Code available for payment source | Admitting: Medical-Surgical

## 2020-02-27 VITALS — BP 99/65 | HR 68 | Temp 98.0°F | Ht 63.0 in | Wt 149.4 lb

## 2020-02-27 DIAGNOSIS — Z114 Encounter for screening for human immunodeficiency virus [HIV]: Secondary | ICD-10-CM

## 2020-02-27 DIAGNOSIS — Z23 Encounter for immunization: Secondary | ICD-10-CM

## 2020-02-27 DIAGNOSIS — Z1159 Encounter for screening for other viral diseases: Secondary | ICD-10-CM

## 2020-02-27 DIAGNOSIS — Z0000 Encounter for general adult medical examination without abnormal findings: Secondary | ICD-10-CM

## 2020-02-27 LAB — CBC
Hemoglobin: 12.8 g/dL (ref 11.7–15.5)
MCV: 89.4 fL (ref 80.0–100.0)
RBC: 4.36 10*6/uL (ref 3.80–5.10)
WBC: 3.9 10*3/uL (ref 3.8–10.8)

## 2020-02-27 NOTE — Patient Instructions (Addendum)
Preventive Care 21-39 Years Old, Female Preventive care refers to visits with your health care provider and lifestyle choices that can promote health and wellness. This includes:  A yearly physical exam. This may also be called an annual well check.  Regular dental visits and eye exams.  Immunizations.  Screening for certain conditions.  Healthy lifestyle choices, such as eating a healthy diet, getting regular exercise, not using drugs or products that contain nicotine and tobacco, and limiting alcohol use. What can I expect for my preventive care visit? Physical exam Your health care provider will check your:  Height and weight. This may be used to calculate body mass index (BMI), which tells if you are at a healthy weight.  Heart rate and blood pressure.  Skin for abnormal spots. Counseling Your health care provider may ask you questions about your:  Alcohol, tobacco, and drug use.  Emotional well-being.  Home and relationship well-being.  Sexual activity.  Eating habits.  Work and work environment.  Method of birth control.  Menstrual cycle.  Pregnancy history. What immunizations do I need?  Influenza (flu) vaccine  This is recommended every year. Tetanus, diphtheria, and pertussis (Tdap) vaccine  You may need a Td booster every 10 years. Varicella (chickenpox) vaccine  You may need this if you have not been vaccinated. Human papillomavirus (HPV) vaccine  If recommended by your health care provider, you may need three doses over 6 months. Measles, mumps, and rubella (MMR) vaccine  You may need at least one dose of MMR. You may also need a second dose. Meningococcal conjugate (MenACWY) vaccine  One dose is recommended if you are age 19-21 years and a first-year college student living in a residence hall, or if you have one of several medical conditions. You may also need additional booster doses. Pneumococcal conjugate (PCV13) vaccine  You may need  this if you have certain conditions and were not previously vaccinated. Pneumococcal polysaccharide (PPSV23) vaccine  You may need one or two doses if you smoke cigarettes or if you have certain conditions. Hepatitis A vaccine  You may need this if you have certain conditions or if you travel or work in places where you may be exposed to hepatitis A. Hepatitis B vaccine  You may need this if you have certain conditions or if you travel or work in places where you may be exposed to hepatitis B. Haemophilus influenzae type b (Hib) vaccine  You may need this if you have certain conditions. You may receive vaccines as individual doses or as more than one vaccine together in one shot (combination vaccines). Talk with your health care provider about the risks and benefits of combination vaccines. What tests do I need?  Blood tests  Lipid and cholesterol levels. These may be checked every 5 years starting at age 20.  Hepatitis C test.  Hepatitis B test. Screening  Diabetes screening. This is done by checking your blood sugar (glucose) after you have not eaten for a while (fasting).  Sexually transmitted disease (STD) testing.  BRCA-related cancer screening. This may be done if you have a family history of breast, ovarian, tubal, or peritoneal cancers.  Pelvic exam and Pap test. This may be done every 3 years starting at age 21. Starting at age 30, this may be done every 5 years if you have a Pap test in combination with an HPV test. Talk with your health care provider about your test results, treatment options, and if necessary, the need for more tests.   Follow these instructions at home: Eating and drinking   Eat a diet that includes fresh fruits and vegetables, whole grains, lean protein, and low-fat dairy.  Take vitamin and mineral supplements as recommended by your health care provider.  Do not drink alcohol if: ? Your health care provider tells you not to drink. ? You are  pregnant, may be pregnant, or are planning to become pregnant.  If you drink alcohol: ? Limit how much you have to 0-1 drink a day. ? Be aware of how much alcohol is in your drink. In the [place] one drink equals one 12 oz bottle of beer (355 mL), one 5 oz glass of wine (148 mL), or one 1 oz glass of hard liquor (44 mL). Lifestyle  Take daily care of your teeth and gums.  Stay active. Exercise for at least 30 minutes on 5 or more days each week.  Do not use any products that contain nicotine or tobacco, such as cigarettes, e-cigarettes, and chewing tobacco. If you need help quitting, ask your health care provider.  If you are sexually active, practice safe sex. Use a condom or other form of birth control (contraception) in order to prevent pregnancy and STIs (sexually transmitted infections). If you plan to become pregnant, see your health care provider for a preconception visit. What's next?  Visit your health care provider once a year for a well check visit.  Ask your health care provider how often you should have your eyes and teeth checked.  Stay up to date on all vaccines. This information is not intended to replace advice given to you by your health care provider. Make sure you discuss any questions you have with your health care provider. Document Revised: 01/10/2018 Document Reviewed: 01/10/2018 Elsevier Patient Education  2020 [employer]  Influenza (Flu) Vaccine (Inactivated or Recombinant): What You Need to Know 1. Why get vaccinated? Influenza vaccine can prevent influenza (flu). Flu is a contagious disease that spreads around the [place] every year, usually between October and May. Anyone can get the flu, but it is more dangerous for some people. Infants and young children, people 65 years of age and older, pregnant women, and people with certain health conditions or a weakened immune system are at greatest risk of flu complications. Pneumonia, bronchitis, sinus  infections and ear infections are examples of flu-related complications. If you have a medical condition, such as heart disease, cancer or diabetes, flu can make it worse. Flu can cause fever and chills, sore throat, muscle aches, fatigue, cough, headache, and runny or stuffy nose. Some people may have vomiting and diarrhea, though this is more common in children than adults. Each year thousands of people in the [place] die from flu, and many more are hospitalized. Flu vaccine prevents millions of illnesses and flu-related visits to the doctor each year. 2. Influenza vaccine CDC recommends everyone 6 months of age and older get vaccinated every flu season. Children 6 months through 8 years of age may need 2 doses during a single flu season. Everyone else needs only 1 dose each flu season. It takes about 2 weeks for protection to develop after vaccination. There are many flu viruses, and they are always changing. Each year a new flu vaccine is made to protect against three or four viruses that are likely to cause disease in the upcoming flu season. Even when the vaccine doesn't exactly match these viruses, it may still provide some protection. Influenza vaccine does not cause flu. Influenza   may be given at the same time as other vaccines. 3. Talk with your health care provider Tell your vaccine provider if the person getting the vaccine:  Has had an allergic reaction after a previous dose of influenza vaccine, or has any severe, life-threatening allergies.  Has ever had Guillain-Barr Syndrome (also called GBS). In some cases, your health care provider may decide to postpone influenza vaccination to a future visit. People with minor illnesses, such as a cold, may be vaccinated. People who are moderately or severely ill should usually wait until they recover before getting influenza vaccine. Your health care provider can give you more information. 4. Risks of a vaccine  reaction  Soreness, redness, and swelling where shot is given, fever, muscle aches, and headache can happen after influenza vaccine.  There may be a very small increased risk of Guillain-Barr Syndrome (GBS) after inactivated influenza vaccine (the flu shot). Young children who get the flu shot along with pneumococcal vaccine (PCV13), and/or DTaP vaccine at the same time might be slightly more likely to have a seizure caused by fever. Tell your health care provider if a child who is getting flu vaccine has ever had a seizure. People sometimes faint after medical procedures, including vaccination. Tell your provider if you feel dizzy or have vision changes or ringing in the ears. As with any medicine, there is a very remote chance of a vaccine causing a severe allergic reaction, other serious injury, or death. 5. What if there is a serious problem? An allergic reaction could occur after the vaccinated person leaves the clinic. If you see signs of a severe allergic reaction (hives, swelling of the face and throat, difficulty breathing, a fast heartbeat, dizziness, or weakness), call 9-1-1 and get the person to the nearest hospital. For other signs that concern you, call your health care provider. Adverse reactions should be reported to the Vaccine Adverse Event Reporting System (VAERS). Your health care provider will usually file this report, or you can do it yourself. Visit the VAERS website at www.vaers.SamedayNews.es or call 218-697-4035.VAERS is only for reporting reactions, and VAERS staff do not give medical advice. 6. The National Vaccine Injury Compensation Program The [employer] Vaccine Injury Compensation Program (VICP) is a federal program that was created to compensate people who may have been injured by certain vaccines. Visit the VICP website at GoldCloset.com.ee or call 682-742-7872 to learn about the program and about filing a claim. There is a time limit to file a claim for  compensation. 7. How can I learn more?  Ask your healthcare provider.  Call your local or state health department.  Contact the Centers for Disease Control and Prevention (CDC): ? Call (202)522-7546 (1-800-CDC-INFO) or ? Visit CDC's https://gibson.com/ Vaccine Information Statement (Interim) Inactivated Influenza Vaccine (12/27/2017) This information is not intended to replace advice given to you by your health care provider. Make sure you discuss any questions you have with your health care provider. Document Revised: 08/20/2018 Document Reviewed: 12/31/2017 Elsevier Patient Education  [employer].

## 2020-02-27 NOTE — Progress Notes (Signed)
HPI: Jennifer Adams is a 39 y.o. 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  she presents to [facility] today, 02/27/20,  for chief complaint of: Annual physical exam  Dentist: [facility] in August, has a few cavities, some TMJ, tries to get cleanings every 6 months Eye exam: yearly, wears glasses and contacts Exercise: none, no time Diet: no special diets Pap smear: UTD, 04/2019, normal Mammogram: Not yet Colon cancer screening: Not yet COVID vaccine: done  Concerns: None  Past medical, surgical, social and family history reviewed:  Patient Active Problem List   Diagnosis Date Noted  . History of multiple miscarriages   . Hepatitis B 04/02/2012    Past Surgical History:  Procedure Laterality Date  . DILATION AND CURETTAGE OF UTERUS      Social History   Tobacco Use  . Smoking status: Never Smoker  . Smokeless tobacco: Never Used  Substance Use Topics  . Alcohol use: No    Family History  Problem Relation Age of Onset  . Hypertension Father   . Cancer Maternal Uncle   . Tongue cancer Paternal Uncle      Current medication list and allergy/intolerance information reviewed:    Current Outpatient Medications  Medication Sig Dispense Refill  . Lidocaine HCl 4.12 % CREA Apply 1 application topically 3 (three) times daily as needed (skin pain). (Patient not taking: Reported on 02/27/2020) 28.3 g 1  . Prenatal Vit-Fe Fumarate-FA (PRENATAL MULTIVITAMIN) TABS tablet Take 1 tablet by mouth daily at 12 noon. (Patient not taking: Reported on 02/27/2020)    . VIREAD 300 MG tablet Take 1 tablet by mouth daily. (Patient not taking: Reported on 02/27/2020)     No current facility-administered medications for this visit.    No Known Allergies    Review of  Systems:  Constitutional:  No  fever, no chills, No recent illness, No unintentional weight changes. No significant fatigue.   HEENT: + intermittent headache, + mild vision change, no hearing change, No sore throat, No  sinus pressure  Cardiac: No chest pain, No  pressure, No palpitations, No  Orthopnea  Respiratory:  No  shortness of breath. + intermittent nonproductive Cough  Gastrointestinal: No  abdominal pain, No  nausea, No  vomiting,  No  blood in stool, No  diarrhea, + intermittent constipation   Musculoskeletal: No new myalgia/arthralgia  Skin: No  Rash, No other wounds/concerning lesions  Genitourinary: No  incontinence, No  abnormal genital bleeding, No abnormal genital discharge  Hem/Onc: No  easy bruising/bleeding, No  abnormal lymph node  Endocrine: No cold intolerance,  No heat intolerance. No polyuria/polydipsia/polyphagia   Neurologic: No  weakness, No  dizziness, No  slurred speech/focal weakness/facial droop  Psychiatric: No  concerns with depression, No concerns with anxiety, No sleep problems, + mood problems  Exam:  BP 99/65   Pulse 68   Temp 98 F (36.7 C) (Oral)   Ht '5\' 3"'  (1.6 m)   Wt 149 lb 6.4 oz (67.8 kg)   LMP 02/25/2020   SpO2 99%   BMI 26.47 kg/m   Constitutional: VS see above. General Appearance: alert, well-developed, well-nourished, NAD  Eyes: Normal lids and conjunctive, non-icteric sclera  Ears, Nose, Mouth, Throat: MMM, Normal external inspection ears/nares/mouth/lips/gums. TM normal bilaterally. Pharynx/tonsils no erythema, no exudate. Nasal mucosa normal.   Neck: No  masses, trachea midline. No thyroid enlargement. No tenderness/mass appreciated. No lymphadenopathy  Respiratory: Normal respiratory effort. no wheeze, no rhonchi, no rales  Cardiovascular: S1/S2 normal, no murmur, no rub/gallop auscultated. RRR. No lower extremity edema. Pedal pulse II/IV bilaterally DP and PT. No carotid bruit or JVD. No abdominal aortic  bruit.  Gastrointestinal: Nontender, no masses. No hepatomegaly, no splenomegaly. No hernia appreciated. Bowel sounds normal. Rectal exam deferred.   Musculoskeletal: Gait normal. No clubbing/cyanosis of digits.   Neurological: Normal balance/coordination. No tremor. No cranial nerve deficit on limited exam. Motor and sensation intact and symmetric. Cerebellar reflexes intact.   Skin: warm, dry, intact. No rash/ulcer. No concerning nevi or subq nodules on limited exam.    Psychiatric: Normal judgment/insight. Normal mood and affect. Oriented x3.    No results found for this or any previous visit (from the past 72 hour(s)).  No results found.   ASSESSMENT/PLAN:   1. Annual physical exam Checking CBC, CMP, and lipid panel today.  Checking UA today as indicated on her school physical form.  School form on hand and will be completed when her results are available.  Once her form is completed, we will give her a call and let her know she can pick it up.  2. Encounter for screening for HIV Has completed this in the past so no need to add screening today.  3. Need for hepatitis C screening test Discussed screening for hepatitis C.  She is agreeable so we will add this to her today.   Orders Placed This Encounter  Procedures  . Flu Vaccine QUAD 36+ mos IM  . Hepatitis C antibody  . CBC  . COMPLETE METABOLIC PANEL WITH GFR  . Lipid panel  . Urinalysis, Routine w reflex microscopic    No orders of the defined types were placed in this encounter.   Patient Instructions  Preventive Care 47-40 Years Old, Female Preventive care refers to visits with your health care provider and lifestyle choices that can promote health and wellness. This includes:  A yearly physical exam. This may also be called an annual well check.  Regular dental visits and eye exams.  Immunizations.  Screening for certain conditions.  Healthy lifestyle choices, such as eating a healthy diet, getting  regular exercise, not using drugs or products that contain nicotine and tobacco, and limiting alcohol use. What can I expect for my preventive care visit? Physical exam Your health care provider will check your:  Height and weight. This may be used to calculate body mass index (BMI), which tells if you are at a healthy weight.  Heart rate and blood pressure.  Skin for abnormal spots. Counseling Your health care provider may ask you questions about your:  Alcohol, tobacco, and drug use.  Emotional well-being.  Home and relationship well-being.  Sexual activity.  Eating habits.  Work and work Statistician.  Method of birth control.  Menstrual cycle.  Pregnancy history. What immunizations do I need?  Influenza (flu) vaccine  This is recommended every year. Tetanus, diphtheria, and pertussis (Tdap) vaccine  You may need a Td booster every 10 years. Varicella (chickenpox) vaccine  You may need this if you have not been vaccinated. Human papillomavirus (HPV) vaccine  If recommended by your health care provider, you may need three doses over 6 months. Measles, mumps, and rubella (MMR) vaccine  You may need at least one dose of MMR. You may also need a second dose. Meningococcal conjugate (MenACWY) vaccine  One dose is recommended  if you are age 67-21 years and a first-year college student living in a residence hall, or if you have one of several medical conditions. You may also need additional booster doses. Pneumococcal conjugate (PCV13) vaccine  You may need this if you have certain conditions and were not previously vaccinated. Pneumococcal polysaccharide (PPSV23) vaccine  You may need one or two doses if you smoke cigarettes or if you have certain conditions. Hepatitis A vaccine  You may need this if you have certain conditions or if you travel or work in places where you may be exposed to hepatitis A. Hepatitis B vaccine  You may need this if you have certain  conditions or if you travel or work in places where you may be exposed to hepatitis B. Haemophilus influenzae type b (Hib) vaccine  You may need this if you have certain conditions. You may receive vaccines as individual doses or as more than one vaccine together in one shot (combination vaccines). Talk with your health care provider about the risks and benefits of combination vaccines. What tests do I need?  Blood tests  Lipid and cholesterol levels. These may be checked every 5 years starting at age 23.  Hepatitis C test.  Hepatitis B test. Screening  Diabetes screening. This is done by checking your blood sugar (glucose) after you have not eaten for a while (fasting).  Sexually transmitted disease (STD) testing.  BRCA-related cancer screening. This may be done if you have a family history of breast, ovarian, tubal, or peritoneal cancers.  Pelvic exam and Pap test. This may be done every 3 years starting at age 81. Starting at age 69, this may be done every 5 years if you have a Pap test in combination with an HPV test. Talk with your health care provider about your test results, treatment options, and if necessary, the need for more tests. Follow these instructions at home: Eating and drinking   Eat a diet that includes fresh fruits and vegetables, whole grains, lean protein, and low-fat dairy.  Take vitamin and mineral supplements as recommended by your health care provider.  Do not drink alcohol if: ? Your health care provider tells you not to drink. ? You are pregnant, may be pregnant, or are planning to become pregnant.  If you drink alcohol: ? Limit how much you have to 0-1 drink a day. ? Be aware of how much alcohol is in your drink. In the [place] one drink equals one 12 oz bottle of beer (355 mL), one 5 oz glass of wine (148 mL), or one 1 oz glass of hard liquor (44 mL). Lifestyle  Take daily care of your teeth and gums.  Stay active. Exercise for at least 30  minutes on 5 or more days each week.  Do not use any products that contain nicotine or tobacco, such as cigarettes, e-cigarettes, and chewing tobacco. If you need help quitting, ask your health care provider.  If you are sexually active, practice safe sex. Use a condom or other form of birth control (contraception) in order to prevent pregnancy and STIs (sexually transmitted infections). If you plan to become pregnant, see your health care provider for a preconception visit. What's next?  Visit your health care provider once a year for a well check visit.  Ask your health care provider how often you should have your eyes and teeth checked.  Stay up to date on all vaccines. This information is not intended to replace advice given to you by  your health care provider. Make sure you discuss any questions you have with your health care provider. Document Revised: 01/10/2018 Document Reviewed: 01/10/2018 Elsevier Patient Education  [place]. Influenza (Flu) Vaccine (Inactivated or Recombinant): What You Need to Know 1. Why get vaccinated? Influenza vaccine can prevent influenza (flu). Flu is a contagious disease that spreads around the [place] every year, usually between October and May. Anyone can get the flu, but it is more dangerous for some people. Infants and young children, people 2 years of age and older, pregnant women, and people with certain health conditions or a weakened immune system are at greatest risk of flu complications. Pneumonia, bronchitis, sinus infections and ear infections are examples of flu-related complications. If you have a medical condition, such as heart disease, cancer or diabetes, flu can make it worse. Flu can cause fever and chills, sore throat, muscle aches, fatigue, cough, headache, and runny or stuffy nose. Some people may have vomiting and diarrhea, though this is more common in children than adults. Each year thousands of people in the [place]  States die from flu, and many more are hospitalized. Flu vaccine prevents millions of illnesses and flu-related visits to the doctor each year. 2. Influenza vaccine CDC recommends everyone 40 months of age and older get vaccinated every flu season. Children 6 months through 38 years of age may need 2 doses during a single flu season. Everyone else needs only 1 dose each flu season. It takes about 2 weeks for protection to develop after vaccination. There are many flu viruses, and they are always changing. Each year a new flu vaccine is made to protect against three or four viruses that are likely to cause disease in the upcoming flu season. Even when the vaccine doesn't exactly match these viruses, it may still provide some protection. Influenza vaccine does not cause flu. Influenza vaccine may be given at the same time as other vaccines. 3. Talk with your health care provider Tell your vaccine provider if the person getting the vaccine:  Has had an allergic reaction after a previous dose of influenza vaccine, or has any severe, life-threatening allergies.  Has ever had Guillain-Barr Syndrome (also called GBS). In some cases, your health care provider may decide to postpone influenza vaccination to a future visit. People with minor illnesses, such as a cold, may be vaccinated. People who are moderately or severely ill should usually wait until they recover before getting influenza vaccine. Your health care provider can give you more information. 4. Risks of a vaccine reaction  Soreness, redness, and swelling where shot is given, fever, muscle aches, and headache can happen after influenza vaccine.  There may be a very small increased risk of Guillain-Barr Syndrome (GBS) after inactivated influenza vaccine (the flu shot). Young children who get the flu shot along with pneumococcal vaccine (PCV13), and/or DTaP vaccine at the same time might be slightly more likely to have a seizure caused by  fever. Tell your health care provider if a child who is getting flu vaccine has ever had a seizure. People sometimes faint after medical procedures, including vaccination. Tell your provider if you feel dizzy or have vision changes or ringing in the ears. As with any medicine, there is a very remote chance of a vaccine causing a severe allergic reaction, other serious injury, or death. 5. What if there is a serious problem? An allergic reaction could occur after the vaccinated person leaves the clinic. If you see signs of a severe  allergic reaction (hives, swelling of the face and throat, difficulty breathing, a fast heartbeat, dizziness, or weakness), call 9-1-1 and get the person to the nearest hospital. For other signs that concern you, call your health care provider. Adverse reactions should be reported to the Vaccine Adverse Event Reporting System (VAERS). Your health care provider will usually file this report, or you can do it yourself. Visit the VAERS website at www.vaers.SamedayNews.es or call 5090902330.VAERS is only for reporting reactions, and VAERS staff do not give medical advice. 6. The National Vaccine Injury Compensation Program The [employer] Vaccine Injury Compensation Program (VICP) is a federal program that was created to compensate people who may have been injured by certain vaccines. Visit the VICP website at GoldCloset.com.ee or call 7434774222 to learn about the program and about filing a claim. There is a time limit to file a claim for compensation. 7. How can I learn more?  Ask your healthcare provider.  Call your local or state health department.  Contact the Centers for Disease Control and Prevention (CDC): ? Call 917-820-3127 (1-800-CDC-INFO) or ? Visit CDC's https://gibson.com/ Vaccine Information Statement (Interim) Inactivated Influenza Vaccine (12/27/2017) This information is not intended to replace advice given to you by your health care provider. Make  sure you discuss any questions you have with your health care provider. Document Revised: 08/20/2018 Document Reviewed: 12/31/2017 Elsevier Patient Education  [place].   Follow-up plan: Return in about 1 year (around 02/26/2021) for annual physical exam or sooner if needed.  Clearnce Sorrel, DNP, APRN, FNP-BC [facility]

## 2020-02-28 LAB — CBC: Platelets: 297 10*3/uL (ref 140–400)

## 2020-03-01 LAB — COMPLETE METABOLIC PANEL WITH GFR
AG Ratio: 1.6 (calc) (ref 1.0–2.5)
ALT: 29 U/L (ref 6–29)
AST: 23 U/L (ref 10–30)
Albumin: 4.4 g/dL (ref 3.6–5.1)
Alkaline phosphatase (APISO): 44 U/L (ref 31–125)
BUN: 8 mg/dL (ref 7–25)
CO2: 28 mmol/L (ref 20–32)
Calcium: 9.4 mg/dL (ref 8.6–10.2)
Chloride: 107 mmol/L (ref 98–110)
Creat: 0.67 mg/dL (ref 0.50–1.10)
GFR, Est African American: 129 mL/min/{1.73_m2} (ref >=60)
GFR, Est Non African American: 112 mL/min/{1.73_m2} (ref >=60)
Globulin: 2.7 g/dL (calc) (ref 1.9–3.7)
Glucose, Bld: 83 mg/dL (ref 65–139)
Potassium: 4.2 mmol/L (ref 3.5–5.3)
Sodium: 142 mmol/L (ref 135–146)
Total Bilirubin: 0.8 mg/dL (ref 0.2–1.2)
Total Protein: 7.1 g/dL (ref 6.1–8.1)

## 2020-03-01 LAB — URINALYSIS, ROUTINE W REFLEX MICROSCOPIC
Bilirubin Urine: NEGATIVE
Glucose, UA: NEGATIVE
Hgb urine dipstick: NEGATIVE
Ketones, ur: NEGATIVE
Leukocytes,Ua: NEGATIVE
Nitrite: NEGATIVE
Protein, ur: NEGATIVE
Specific Gravity, Urine: 1.015 (ref 1.001–1.03)
pH: 7.5 (ref 5.0–8.0)

## 2020-03-01 LAB — LIPID PANEL
Cholesterol: 180 mg/dL (ref <200)
HDL: 60 mg/dL (ref >=50)
LDL Cholesterol (Calc): 101 mg/dL (calc) — ABNORMAL HIGH
Non-HDL Cholesterol (Calc): 120 mg/dL (calc) (ref <130)
Total CHOL/HDL Ratio: 3 (calc) (ref <5.0)
Triglycerides: 101 mg/dL (ref <150)

## 2020-03-01 LAB — HEPATITIS C ANTIBODY
Hepatitis C Ab: NONREACTIVE
SIGNAL TO CUT-OFF: 0.02 (ref <1.00)

## 2020-03-01 LAB — CBC
HCT: 39 % (ref 35.0–45.0)
MCH: 29.4 pg (ref 27.0–33.0)
MCHC: 32.8 g/dL (ref 32.0–36.0)
MPV: 9 fL (ref 7.5–12.5)
RDW: 11.3 % (ref 11.0–15.0)

## 2020-05-10 ENCOUNTER — Telehealth: Payer: Self-pay | Admitting: Medical-Surgical

## 2020-05-10 DIAGNOSIS — Z111 Encounter for screening for respiratory tuberculosis: Secondary | ICD-10-CM

## 2020-05-10 NOTE — Telephone Encounter (Signed)
If school requires PPD she needs that done, I don't care either way and I'm ok to order either one

## 2020-05-10 NOTE — Telephone Encounter (Signed)
Patient needs to have TB skin test for school and wishes to do the Quantiferon tb blood test instead.  Are you OK with this?  If so, can you place order for lab test.

## 2020-05-12 NOTE — Telephone Encounter (Signed)
Called patient and clarified that quantiferon test is requested. Orders placed and instructions provided to patient.

## 2020-05-18 ENCOUNTER — Other Ambulatory Visit: Payer: Self-pay

## 2020-05-18 DIAGNOSIS — Z111 Encounter for screening for respiratory tuberculosis: Secondary | ICD-10-CM

## 2020-05-20 LAB — QUANTIFERON-TB GOLD PLUS
Mitogen-NIL: >10 [IU]/mL
NIL: 0.03 [IU]/mL
QuantiFERON-TB Gold Plus: NEGATIVE
TB1-NIL: 0 [IU]/mL
TB2-NIL: 0 [IU]/mL

## 2020-05-20 NOTE — Progress Notes (Signed)
TB test negative.

## 2020-07-22 ENCOUNTER — Other Ambulatory Visit: Payer: Self-pay

## 2020-07-22 ENCOUNTER — Encounter: Payer: Self-pay | Admitting: Family Medicine

## 2020-07-22 ENCOUNTER — Ambulatory Visit (INDEPENDENT_AMBULATORY_CARE_PROVIDER_SITE_OTHER): Payer: No Typology Code available for payment source | Admitting: Family Medicine

## 2020-07-22 VITALS — BP 112/70 | HR 77 | Wt 148.3 lb

## 2020-07-22 DIAGNOSIS — B029 Zoster without complications: Secondary | ICD-10-CM

## 2020-07-22 MED ORDER — VALACYCLOVIR HCL 1 G PO TABS: 1000.0000 mg | ORAL_TABLET | Freq: Two times a day (BID) | ORAL | 0 refills | Status: DC

## 2020-07-22 MED ORDER — VALACYCLOVIR HCL 1 G PO TABS: 1000.0000 mg | ORAL_TABLET | Freq: Three times a day (TID) | ORAL | 0 refills | Status: AC

## 2020-07-22 NOTE — Progress Notes (Signed)
Acute Office Visit  Subjective:    Patient ID: Jennifer Adams, female    DOB: May 28, 1980, 40 y.o.   MRN: [id number]  Chief Complaint  Patient presents with   Back Pain   Rash    HPI Patient is in today for rash.  Jilene states almost a week ago she started to notice a strange nerve-like discomfort/pain, burning/stinging sensation to her left lower back. About 2-3 days ago, she noticed a red bump appear. She reports she had a similar episode on the right lower back about 2 years ago. At that time she was treated as shingles and it resolved.   Today she reports she has the superficial nerve pain, stinging, tenderness, burning sensation. Reports the area is very sensitive, even to her clothes lightly touching it. Se reports a quarter-size raised red area that looks like it may be starting to form a blister. She denies any fevers, trauma/injury, musculoskeletal pain, decreased range of motion, urinary or GI symptoms. Reports she has been very stressed lately.     Past Medical History:  Diagnosis Date   H/O varicella    Hepatitis    hepatitis B   Hepatitis B    History of multiple miscarriages    No pertinent past medical history    Trisomy 70 in child of prior pregnancy, currently pregnant     Past Surgical History:  Procedure Laterality Date   DILATION AND CURETTAGE OF UTERUS      Family History  Problem Relation Age of Onset   Hypertension Father    Cancer Maternal Uncle    Tongue cancer Paternal Uncle     Social History   Socioeconomic History   Marital status: Married    Spouse name: Not on file   Number of children: Not on file   Years of education: Not on file   Highest education level: Not on file  Occupational History   Not on file  Tobacco Use   Smoking status: Never Smoker   Smokeless tobacco: Never Used  Vaping Use   Vaping Use: Never used  Substance and Sexual Activity   Alcohol use: No   Drug use: No   Sexual activity: Yes    Birth  control/protection: Condom  Other Topics Concern   Not on file  Social History Narrative   Not on file   Social Determinants of Health   Financial Resource Strain: Not on file  Food Insecurity: Not on file  Transportation Needs: Not on file  Physical Activity: Not on file  Stress: Not on file  Social Connections: Not on file  Intimate Partner Violence: Not on file    No outpatient medications prior to visit.   No facility-administered medications prior to visit.    No Known Allergies  Review of Systems All review of systems negative except what is listed in the HPI     Objective:    Physical Exam Vitals reviewed.  Constitutional:      Appearance: Normal appearance.  HENT:     Head: Normocephalic and atraumatic.  Cardiovascular:     Rate and Rhythm: Normal rate.  Pulmonary:     Effort: Pulmonary effort is normal.  Musculoskeletal:     Cervical back: Normal range of motion.  Skin:    General: Skin is warm and dry.     Findings: Rash present.     Comments: Left lower back with approximately 3-4 cm circular area of erythema, small blister beginning to from, tender to palpation, no drainage  Neurological:     General: No focal deficit present.     Mental Status: She is alert and oriented to person, place, and time.  Psychiatric:        Mood and Affect: Mood normal.        Behavior: Behavior normal.        Thought Content: Thought content normal.        Judgment: Judgment normal.     BP 112/70    Pulse 77    Wt 148 lb 4.8 oz (67.3 kg)    SpO2 97%    BMI 26.27 kg/m  Wt Readings from Last 3 Encounters:  07/22/20 148 lb 4.8 oz (67.3 kg)  02/27/20 149 lb 6.4 oz (67.8 kg)  05/06/19 146 lb 1.3 oz (66.3 kg)    Health Maintenance Due  Topic Date Due   COVID-19 Vaccine (3 - Booster for Pfizer series) 01/10/2020    There are no preventive care reminders to display for this patient.   No results found for: TSH Lab Results  Component Value Date   WBC 3.9  02/27/2020   HGB 12.8 02/27/2020   HCT 39.0 02/27/2020   MCV 89.4 02/27/2020   PLT 297 02/27/2020   Lab Results  Component Value Date   NA 142 02/27/2020   K 4.2 02/27/2020   CO2 28 02/27/2020   GLUCOSE 83 02/27/2020   BUN 8 02/27/2020   CREATININE 0.67 02/27/2020   BILITOT 0.8 02/27/2020   ALKPHOS 123 11/17/2016   AST 23 02/27/2020   ALT 29 02/27/2020   PROT 7.1 02/27/2020   ALBUMIN 3.4 (L) 11/17/2016   CALCIUM 9.4 02/27/2020   ANIONGAP 9 11/17/2016   Lab Results  Component Value Date   CHOL 180 02/27/2020   Lab Results  Component Value Date   HDL 60 02/27/2020   Lab Results  Component Value Date   LDLCALC 101 (H) 02/27/2020   Lab Results  Component Value Date   TRIG 101 02/27/2020   Lab Results  Component Value Date   CHOLHDL 3.0 02/27/2020   No results found for: HGBA1C     Assessment & Plan:   1. Herpes zoster without complication Area is suspicious for mild shingles. Will go ahead and treat with Valtrex. Education provided on shingles.   - valACYclovir (VALTREX) 1000 MG tablet; Take 1 tablet (1,000 mg total) by mouth 3 (three) times daily for 7 days.  Dispense: 21 tablet; Refill: 0  At end of visit, patient briefly mentioned occasional headaches. Reports she has been very stressed recently, headaches typically improve with ibuprofen and rest. Educated on red flags that would seek urgent evaluation. Discussed some different types of headaches and encouraged her to keep a headache diary and practice stress management. Encouraged her to follow-up if she notices worsening headaches or those that no longer respond to conservative management.  Follow-up if symptoms worsen or fail to improve.   Clayborne Dana, NP

## 2020-07-22 NOTE — Patient Instructions (Addendum)
Shingles  Shingles is an infection. It gives you a painful skin rash and blisters that have fluid in them. Shingles is caused by the same germ (virus) that causes chickenpox. Shingles only happens in people who:  Have had chickenpox.  Have been given a shot of medicine (vaccine) to protect against chickenpox. Shingles is rare in this group. The first symptoms of shingles may be itching, tingling, or pain in an area on your skin. A rash will show on your skin a few days or weeks later. The rash is likely to be on one side of your body. The rash usually has a shape like a belt or a band. Over time, the rash turns into fluid-filled blisters. The blisters will break open, change into scabs, and dry up. Medicines may:  Help with pain and itching.  Help you get better sooner.  Help to prevent long-term problems. Follow these instructions at home: Medicines  Take over-the-counter and prescription medicines only as told by your doctor.  Put on an anti-itch cream or numbing cream where you have a rash, blisters, or scabs. Do this as told by your doctor. Helping with itching and discomfort  Put cold, wet cloths (cold compresses) on the area of the rash or blisters as told by your doctor.  Cool baths can help you feel better. Try adding baking soda or dry oatmeal to the water to lessen itching. Do not bathe in hot water.   Blister and rash care  Keep your rash covered with a loose bandage (dressing).  Wear loose clothing that does not rub on your rash.  Keep your rash and blisters clean. To do this, wash the area with mild soap and cool water as told by your doctor.  Check your rash every day for signs of infection. Check for: ? More redness, swelling, or pain. ? Fluid or blood. ? Warmth. ? Pus or a bad smell.  Do not scratch your rash. Do not pick at your blisters. To help you to not scratch: ? Keep your fingernails clean and cut short. ? Wear gloves or mittens when you sleep, if  scratching is a problem. General instructions  Rest as told by your doctor.  Keep all follow-up visits as told by your doctor. This is important.  Wash your hands often with soap and water. If soap and water are not available, use hand sanitizer. Doing this lowers your chance of getting a skin infection caused by germs (bacteria).  Your infection can cause chickenpox in people who have never had chickenpox or never got a shot of chickenpox vaccine. If you have blisters that did not change into scabs yet, try not to touch other people or be around other people, especially: ? Babies. ? Pregnant women. ? Children who have areas of red, itchy, or rough skin (eczema). ? Very old people who have transplants. ? People who have a long-term (chronic) sickness, like cancer or AIDS. Contact a doctor if:  Your pain does not get better with medicine.  Your pain does not get better after the rash heals.  You have any signs of infection in the rash area. These signs include: ? More redness, swelling, or pain around the rash. ? Fluid or blood coming from the rash. ? The rash area feeling warm to the touch. ? Pus or a bad smell coming from the rash. Get help right away if:  The rash is on your face or nose.  You have pain in your face or pain   by your eye.  You lose feeling on one side of your face.  You have trouble seeing.  You have ear pain, or you have ringing in your ear.  You have a loss of taste.  Your condition gets worse. Summary  Shingles gives you a painful skin rash and blisters that have fluid in them.  Shingles is an infection. It is caused by the same germ (virus) that causes chickenpox.  Keep your rash covered with a loose bandage (dressing). Wear loose clothing that does not rub on your rash.  If you have blisters that did not change into scabs yet, try not to touch other people or be around people. This information is not intended to replace advice given to you by  your health care provider. Make sure you discuss any questions you have with your health care provider. Document Revised: 08/23/2018 Document Reviewed: 01/03/2017 Elsevier Patient Education  2021 Elsevier Inc.  

## 2020-08-13 ENCOUNTER — Encounter: Payer: Self-pay | Admitting: Medical-Surgical

## 2020-08-13 ENCOUNTER — Telehealth (INDEPENDENT_AMBULATORY_CARE_PROVIDER_SITE_OTHER): Payer: No Typology Code available for payment source | Admitting: Medical-Surgical

## 2020-08-13 DIAGNOSIS — B001 Herpesviral vesicular dermatitis: Secondary | ICD-10-CM | POA: Diagnosis not present

## 2020-08-13 DIAGNOSIS — R059 Cough, unspecified: Secondary | ICD-10-CM | POA: Diagnosis not present

## 2020-08-13 MED ORDER — VALACYCLOVIR HCL 500 MG PO TABS: 500.0000 mg | ORAL_TABLET | Freq: Two times a day (BID) | ORAL | 5 refills | Status: DC

## 2020-08-13 MED ORDER — BENZONATATE 200 MG PO CAPS: 200.0000 mg | ORAL_CAPSULE | Freq: Three times a day (TID) | ORAL | 0 refills | Status: DC | PRN

## 2020-08-13 NOTE — Progress Notes (Signed)
Virtual Visit via Video Note  I connected with Jennifer Adams on 08/13/20 at  1:40 PM EDT by a video enabled telemedicine application and verified that I am speaking with the correct person using two identifiers.   I discussed the limitations of evaluation and management by telemedicine and the availability of in person appointments. The patient expressed understanding and agreed to proceed.  Patient location: home Provider locations: office  Subjective:    CC: Cough  HPI: Pleasant 40 year old female presenting today with complaints of approximately week of intermittent nonproductive coughing.  Notes that on the first day of her symptoms, she had a severe headache with vomiting x1 but this resolved after the first 24 hours.  She is continued to cough and notes that her throat is itchy/scratchy.  She does have watery eyes and rhinorrhea after a strong coughing fit.  Denies fevers, chills, sore throat, sneezing, GI symptoms, sinus congestion, and ear pain/pressure.  She has been using over-the-counter cough drops which do provide some temporary help.  No other over-the-counter or home remedies attempted.  She does have a history of cold sores that are brought on by stress.  Was recently treated with Valtrex and noted that it helped tremendously.  Because she is currently in school and her stress level is already increased, she would like to start suppression therapy with Valtrex once daily to reduce her outbreaks.  Past medical history, Surgical history, Family history not pertinant except as noted below, Social history, Allergies, and medications have been entered into the medical record, reviewed, and corrections made.   Review of Systems: See HPI for pertinent positives and negatives.   Objective:    General: Speaking clearly in complete sentences without any shortness of breath.  Alert and oriented x3.  Normal judgment. No apparent acute distress.  Impression and Recommendations:    1.  Cough Potential viral upper respiratory infection more likely related to seasonal allergies.  Recommend starting an oral daily antihistamine such as Zyrtec.  Sending in [place] every 8 hours as needed for cough.  Continue cough drops as needed.  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  I discussed the assessment and treatment plan with the patient. The patient was provided an opportunity to ask questions and all were answered. The patient agreed with the plan and demonstrated an understanding of the instructions.   The patient was advised to call back or seek an in-person evaluation if the symptoms worsen or if the condition fails to improve as anticipated.  20 minutes of non-face-to-face time was provided during this encounter.  No follow-ups on file.  Thayer Ohm, DNP, APRN, FNP-BC [facility]

## 2021-03-04 ENCOUNTER — Encounter: Payer: Self-pay | Admitting: Medical-Surgical

## 2021-03-04 ENCOUNTER — Ambulatory Visit (INDEPENDENT_AMBULATORY_CARE_PROVIDER_SITE_OTHER): Payer: No Typology Code available for payment source | Admitting: Medical-Surgical

## 2021-03-04 ENCOUNTER — Other Ambulatory Visit: Payer: Self-pay

## 2021-03-04 VITALS — BP 113/78 | HR 61 | Resp 20 | Ht 63.0 in | Wt 147.0 lb

## 2021-03-04 DIAGNOSIS — Z23 Encounter for immunization: Secondary | ICD-10-CM

## 2021-03-04 DIAGNOSIS — Z0000 Encounter for general adult medical examination without abnormal findings: Secondary | ICD-10-CM

## 2021-03-04 NOTE — Patient Instructions (Signed)
Preventive Care 21-39 Years Old, Female Preventive care refers to lifestyle choices and visits with your health care provider that can promote health and wellness. This includes: A yearly physical exam. This is also called an annual wellness visit. Regular dental and eye exams. Immunizations. Screening for certain conditions. Healthy lifestyle choices, such as: Eating a healthy diet. Getting regular exercise. Not using drugs or products that contain nicotine and tobacco. Limiting alcohol use. What can I expect for my preventive care visit? Physical exam Your health care provider may check your: Height and weight. These may be used to calculate your BMI (body mass index). BMI is a measurement that tells if you are at a healthy weight. Heart rate and blood pressure. Body temperature. Skin for abnormal spots. Counseling Your health care provider may ask you questions about your: Past medical problems. Family's medical history. Alcohol, tobacco, and drug use. Emotional well-being. Home life and relationship well-being. Sexual activity. Diet, exercise, and sleep habits. Work and work environment. Access to firearms. Method of birth control. Menstrual cycle. Pregnancy history. What immunizations do I need? Vaccines are usually given at various ages, according to a schedule. Your health care provider will recommend vaccines for you based on your age, medical history, and lifestyle or other factors, such as travel or where you work. What tests do I need? Blood tests Lipid and cholesterol levels. These may be checked every 5 years starting at age 20. Hepatitis C test. Hepatitis B test. Screening Diabetes screening. This is done by checking your blood sugar (glucose) after you have not eaten for a while (fasting). STD (sexually transmitted disease) testing, if you are at risk. BRCA-related cancer screening. This may be done if you have a family history of breast, ovarian, tubal, or  peritoneal cancers. Pelvic exam and Pap test. This may be done every 3 years starting at age 21. Starting at age 30, this may be done every 5 years if you have a Pap test in combination with an HPV test. Talk with your health care provider about your test results, treatment options, and if necessary, the need for more tests. Follow these instructions at home: Eating and drinking  Eat a healthy diet that includes fresh fruits and vegetables, whole grains, lean protein, and low-fat dairy products. Take vitamin and mineral supplements as recommended by your health care provider. Do not drink alcohol if: Your health care provider tells you not to drink. You are pregnant, may be pregnant, or are planning to become pregnant. If you drink alcohol: Limit how much you have to 0-1 drink a day. Be aware of how much alcohol is in your drink. In the [place] one drink equals one 12 oz bottle of beer (355 mL), one 5 oz glass of wine (148 mL), or one 1 oz glass of hard liquor (44 mL). Lifestyle Take daily care of your teeth and gums. Brush your teeth every morning and night with fluoride toothpaste. Floss one time each day. Stay active. Exercise for at least 30 minutes 5 or more days each week. Do not use any products that contain nicotine or tobacco, such as cigarettes, e-cigarettes, and chewing tobacco. If you need help quitting, ask your health care provider. Do not use drugs. If you are sexually active, practice safe sex. Use a condom or other form of protection to prevent STIs (sexually transmitted infections). If you do not wish to become pregnant, use a form of birth control. If you plan to become pregnant, see your health care provider   for a prepregnancy visit. Find healthy ways to cope with stress, such as: Meditation, yoga, or listening to music. Journaling. Talking to a trusted person. Spending time with friends and family. Safety Always wear your seat belt while driving or riding in a  vehicle. Do not drive: If you have been drinking alcohol. Do not ride with someone who has been drinking. When you are tired or distracted. While texting. Wear a helmet and other protective equipment during sports activities. If you have firearms in your house, make sure you follow all gun safety procedures. Seek help if you have been physically or sexually abused. What's next? Go to your health care provider once a year for an annual wellness visit. Ask your health care provider how often you should have your eyes and teeth checked. Stay up to date on all vaccines. This information is not intended to replace advice given to you by your health care provider. Make sure you discuss any questions you have with your health care provider. Document Revised: 07/09/2020 Document Reviewed: 01/10/2018 Elsevier Patient Education  2022 Elsevier Inc.  

## 2021-03-04 NOTE — Progress Notes (Signed)
HPI: Jennifer Adams is a 40 y.o. 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  she presents to [facility] today, 03/04/21,  for chief complaint of: Annual physical exam  Dentist: UTD Eye exam: UTD, contacts Exercise: none intentional Diet: well balanced, no restrictions Pap smear: done in 2020 COVID vaccine: done with 1 booster  Concerns: None. Has form for school that needs to be compelted  Past medical, surgical, social and family history reviewed:  Patient Active Problem List   Diagnosis Date Noted   History of multiple miscarriages    Hepatitis B 04/02/2012    Past Surgical History:  Procedure Laterality Date   DILATION AND CURETTAGE OF UTERUS      Social History   Tobacco Use   Smoking status: Never   Smokeless tobacco: Never  Substance Use Topics   Alcohol use: No    Family History  Problem Relation Age of Onset   Hypertension Father    Cancer Maternal Uncle    Tongue cancer Paternal Uncle      Current medication list and allergy/intolerance information reviewed:    No current outpatient medications on file.   No current facility-administered medications for this visit.    No Known Allergies    Review of Systems: Constitutional:  No  fever, no chills, No recent illness, No unintentional weight changes. No significant fatigue.  HEENT: No  headache, no vision change, no hearing change, No sore throat, No  sinus pressure Cardiac: No  chest pain, No  pressure, No palpitations, No  Orthopnea Respiratory:  No  shortness of breath. No  Cough Gastrointestinal: No  abdominal pain, No  nausea, No  vomiting,  No  blood in stool, No  diarrhea, No  constipation  Musculoskeletal: No new myalgia/arthralgia Skin: No  Rash, No other wounds/concerning  lesions Genitourinary: No  incontinence, No  abnormal genital bleeding, No abnormal genital discharge Hem/Onc: No  easy bruising/bleeding, No  abnormal lymph node Endocrine: No cold intolerance,  No heat intolerance. No polyuria/polydipsia/polyphagia  Neurologic: No  weakness, No  dizziness, No  slurred speech/focal weakness/facial droop Psychiatric: No  concerns with depression, No  concerns with anxiety, No sleep problems, No mood problems  Exam:  BP 113/78 (BP Location: Left Arm, Patient Position: Sitting, Cuff Size: Normal)   Pulse 61   Resp 20   Ht '5\' 3"'  (1.6 m)   Wt 147 lb (66.7 kg)   SpO2 99%   BMI 26.04 kg/m  Constitutional: VS see above. General Appearance: alert, well-developed, well-nourished, NAD Eyes: Normal lids and conjunctive, non-icteric sclera Ears, Nose, Mouth, Throat: MMM, Normal external inspection ears/nares/mouth/lips/gums. TM normal bilaterally.   Neck: No masses, trachea midline. No thyroid enlargement. No tenderness/mass appreciated. No lymphadenopathy Respiratory: Normal respiratory effort. no wheeze, no rhonchi, no rales Cardiovascular: S1/S2 normal, no murmur, no rub/gallop auscultated. RRR. No lower extremity edema. Pedal pulse II/IV bilaterally PT. No carotid bruit or JVD. No abdominal aortic bruit. Gastrointestinal: Nontender, no masses. No hepatomegaly, no splenomegaly. No hernia appreciated. Bowel sounds normal. Rectal exam deferred.  Musculoskeletal: Gait normal. No clubbing/cyanosis of digits.  Neurological: Normal balance/coordination. No tremor. No cranial nerve deficit on limited exam. Motor and sensation intact and symmetric. Cerebellar reflexes intact.  Skin: warm, dry, intact. No rash/ulcer. No concerning nevi or subq nodules on limited exam.   Psychiatric: Normal  judgment/insight. Normal mood and affect. Oriented x3.    ASSESSMENT/PLAN:   1. Annual physical exam Checking labs. Wellness information provided with AVS. Form requires  hemoglobin/hematocrit so once available, will complete and be available for pickup.  - Lipid panel - COMPLETE METABOLIC PANEL WITH GFR - CBC with Differential/Platelet  2. Flu vaccine need Flu vaccine given in office today.  - Flu Vaccine QUAD 53moIM (Fluarix, Fluzone & Alfiuria Quad PF)   Orders Placed This Encounter  Procedures   Flu Vaccine QUAD 688moM (Fluarix, Fluzone & Alfiuria Quad PF)   Lipid panel   COMPLETE METABOLIC PANEL WITH GFR   CBC with Differential/Platelet    No orders of the defined types were placed in this encounter.   Patient Instructions  Preventive Care [id number] Old, Female Preventive care refers to lifestyle choices and visits with your health care provider that can promote health and wellness. This includes: A yearly physical exam. This is also called an annual wellness visit. Regular dental and eye exams. Immunizations. Screening for certain conditions. Healthy lifestyle choices, such as: Eating a healthy diet. Getting regular exercise. Not using drugs or products that contain nicotine and tobacco. Limiting alcohol use. What can I expect for my preventive care visit? Physical exam Your health care provider may check your: Height and weight. These may be used to calculate your BMI (body mass index). BMI is a measurement that tells if you are at a healthy weight. Heart rate and blood pressure. Body temperature. Skin for abnormal spots. Counseling Your health care provider may ask you questions about your: Past medical problems. Family's medical history. Alcohol, tobacco, and drug use. Emotional well-being. Home life and relationship well-being. Sexual activity. Diet, exercise, and sleep habits. Work and work enStatisticianAccess to firearms. Method of birth control. Menstrual cycle. Pregnancy history. What immunizations do I need? Vaccines are usually given at various ages, according to a schedule. Your health care provider will  recommend vaccines for you based on your age, medical history, and lifestyle or other factors, such as travel or where you work. What tests do I need? Blood tests Lipid and cholesterol levels. These may be checked every 5 years starting at age 2267Hepatitis C test. Hepatitis B test. Screening Diabetes screening. This is done by checking your blood sugar (glucose) after you have not eaten for a while (fasting). STD (sexually transmitted disease) testing, if you are at risk. BRCA-related cancer screening. This may be done if you have a family history of breast, ovarian, tubal, or peritoneal cancers. Pelvic exam and Pap test. This may be done every 3 years starting at age 3873Starting at age 3685this may be done every 5 years if you have a Pap test in combination with an HPV test. Talk with your health care provider about your test results, treatment options, and if necessary, the need for more tests. Follow these instructions at home: Eating and drinking  Eat a healthy diet that includes fresh fruits and vegetables, whole grains, lean protein, and low-fat dairy products. Take vitamin and mineral supplements as recommended by your health care provider. Do not drink alcohol if: Your health care provider tells you not to drink. You are pregnant, may be pregnant, or are planning to become pregnant. If you drink alcohol: Limit how much you have to 0-1 drink a day. Be aware of how much alcohol is in your drink. In the [place] one drink equals one 12 oz bottle of beer (355 mL), one 5 oz glass  of wine (148 mL), or one 1 oz glass of hard liquor (44 mL). Lifestyle Take daily care of your teeth and gums. Brush your teeth every morning and night with fluoride toothpaste. Floss one time each day. Stay active. Exercise for at least 30 minutes 5 or more days each week. Do not use any products that contain nicotine or tobacco, such as cigarettes, e-cigarettes, and chewing tobacco. If you need help quitting,  ask your health care provider. Do not use drugs. If you are sexually active, practice safe sex. Use a condom or other form of protection to prevent STIs (sexually transmitted infections). If you do not wish to become pregnant, use a form of birth control. If you plan to become pregnant, see your health care provider for a prepregnancy visit. Find healthy ways to cope with stress, such as: Meditation, yoga, or listening to music. Journaling. Talking to a trusted person. Spending time with friends and family. Safety Always wear your seat belt while driving or riding in a vehicle. Do not drive: If you have been drinking alcohol. Do not ride with someone who has been drinking. When you are tired or distracted. While texting. Wear a helmet and other protective equipment during sports activities. If you have firearms in your house, make sure you follow all gun safety procedures. Seek help if you have been physically or sexually abused. What's next? Go to your health care provider once a year for an annual wellness visit. Ask your health care provider how often you should have your eyes and teeth checked. Stay up to date on all vaccines. This information is not intended to replace advice given to you by your health care provider. Make sure you discuss any questions you have with your health care provider. Document Revised: 07/09/2020 Document Reviewed: 01/10/2018 Elsevier Patient Education  2022 [employer].   Follow-up plan: Return in about 1 year (around 03/04/2022) for annual physical exam.  Clearnce Sorrel, DNP, APRN, FNP-BC [facility]

## 2021-03-05 LAB — CBC WITH DIFFERENTIAL/PLATELET
Absolute Monocytes: 239 cells/uL (ref 200–950)
Basophils Absolute: 30 cells/uL (ref 0–200)
Basophils Relative: 0.8 %
Eosinophils Absolute: 129 cells/uL (ref 15–500)
Eosinophils Relative: 3.4 %
HCT: 41.8 % (ref 35.0–45.0)
Hemoglobin: 13.2 g/dL (ref 11.7–15.5)
Lymphs Abs: 1566 cells/uL (ref 850–3900)
MCH: 28.4 pg (ref 27.0–33.0)
MCHC: 31.6 g/dL — ABNORMAL LOW (ref 32.0–36.0)
MCV: 90.1 fL (ref 80.0–100.0)
MPV: 8.9 fL (ref 7.5–12.5)
Monocytes Relative: 6.3 %
Neutro Abs: 1835 cells/uL (ref 1500–7800)
Neutrophils Relative %: 48.3 %
Platelets: 326 10*3/uL (ref 140–400)
RBC: 4.64 10*6/uL (ref 3.80–5.10)
RDW: 11.3 % (ref 11.0–15.0)
Total Lymphocyte: 41.2 %
WBC: 3.8 10*3/uL (ref 3.8–10.8)

## 2021-03-05 LAB — COMPLETE METABOLIC PANEL WITH GFR
AG Ratio: 1.5 (calc) (ref 1.0–2.5)
ALT: 27 U/L (ref 6–29)
AST: 23 U/L (ref 10–30)
Albumin: 4.6 g/dL (ref 3.6–5.1)
Alkaline phosphatase (APISO): 47 U/L (ref 31–125)
BUN: 13 mg/dL (ref 7–25)
CO2: 28 mmol/L (ref 20–32)
Calcium: 9.4 mg/dL (ref 8.6–10.2)
Chloride: 106 mmol/L (ref 98–110)
Creat: 0.55 mg/dL (ref 0.50–0.97)
Globulin: 3.1 g/dL (calc) (ref 1.9–3.7)
Glucose, Bld: 86 mg/dL (ref 65–99)
Potassium: 3.9 mmol/L (ref 3.5–5.3)
Sodium: 141 mmol/L (ref 135–146)
Total Bilirubin: 0.8 mg/dL (ref 0.2–1.2)
Total Protein: 7.7 g/dL (ref 6.1–8.1)
eGFR: 120 mL/min/{1.73_m2} (ref >=60)

## 2021-03-05 LAB — LIPID PANEL
Cholesterol: 234 mg/dL — ABNORMAL HIGH (ref <200)
HDL: 71 mg/dL (ref >=50)
LDL Cholesterol (Calc): 148 mg/dL (calc) — ABNORMAL HIGH
Non-HDL Cholesterol (Calc): 163 mg/dL (calc) — ABNORMAL HIGH (ref <130)
Total CHOL/HDL Ratio: 3.3 (calc) (ref <5.0)
Triglycerides: 53 mg/dL (ref <150)

## 2021-03-18 ENCOUNTER — Encounter: Payer: Self-pay | Admitting: Medical-Surgical

## 2021-05-19 ENCOUNTER — Telehealth: Payer: Self-pay

## 2021-05-19 ENCOUNTER — Other Ambulatory Visit: Payer: No Typology Code available for payment source

## 2021-05-19 ENCOUNTER — Other Ambulatory Visit: Payer: Self-pay

## 2021-05-19 DIAGNOSIS — Z111 Encounter for screening for respiratory tuberculosis: Secondary | ICD-10-CM

## 2021-05-19 NOTE — Telephone Encounter (Signed)
Pt called stating that she needs her yearly Quanitferon test drawn.  Orders placed and pt provided lab information.  Tiajuana Amass, CMA

## 2021-05-22 LAB — QUANTIFERON-TB GOLD PLUS
Mitogen-NIL: >10 IU/mL
NIL: 0.03 IU/mL
QuantiFERON-TB Gold Plus: NEGATIVE
TB1-NIL: 0 IU/mL
TB2-NIL: 0 IU/mL

## 2021-12-21 ENCOUNTER — Encounter: Payer: Self-pay | Admitting: Medical-Surgical

## 2021-12-21 DIAGNOSIS — Z111 Encounter for screening for respiratory tuberculosis: Secondary | ICD-10-CM

## 2022-01-05 LAB — QUANTIFERON-TB GOLD PLUS
Mitogen-NIL: >10 IU/mL
NIL: 0.04 IU/mL
QuantiFERON-TB Gold Plus: NEGATIVE
TB1-NIL: <0 IU/mL
TB2-NIL: <0 IU/mL

## 2022-07-03 ENCOUNTER — Encounter: Payer: Self-pay | Admitting: Medical-Surgical

## 2022-07-03 ENCOUNTER — Ambulatory Visit (INDEPENDENT_AMBULATORY_CARE_PROVIDER_SITE_OTHER): Payer: No Typology Code available for payment source | Admitting: Medical-Surgical

## 2022-07-03 ENCOUNTER — Ambulatory Visit: Payer: No Typology Code available for payment source

## 2022-07-03 VITALS — BP 113/75 | HR 66 | Resp 20 | Ht 63.0 in | Wt 155.1 lb

## 2022-07-03 DIAGNOSIS — M25571 Pain in right ankle and joints of right foot: Secondary | ICD-10-CM

## 2022-07-03 DIAGNOSIS — S99911A Unspecified injury of right ankle, initial encounter: Secondary | ICD-10-CM | POA: Diagnosis not present

## 2022-07-03 DIAGNOSIS — M25471 Effusion, right ankle: Secondary | ICD-10-CM | POA: Diagnosis not present

## 2022-07-03 MED ORDER — FAMOTIDINE 20 MG PO TABS: 20.0000 mg | ORAL_TABLET | Freq: Two times a day (BID) | ORAL | 0 refills | Status: DC

## 2022-07-03 NOTE — Progress Notes (Signed)
   Established Patient Office Visit  Subjective   Patient ID: Jennifer Adams, female   DOB: [birth year] Age: 42 y.o. MRN: [id number]   Chief Complaint  Patient presents with   Fall   Ankle Injury   HPI Pleasant 42 year old female presenting today for valuation of right ankle pain after missing a step on Thursday evening.  She notes that the pain was not excessive when she injured it and she iced it immediately.  After resting for approximately 15 minutes, she attempted to walk and was able to take 4 steps however weightbearing was quite uncomfortable.  Since then she has had swelling and bruising of the right ankle.  Having difficulty with walking.  Has been using an ankle stabilizer which does seem to help some.  Currently her pain is rated 0 when nonweightbearing.  When she does bear weight, she rates her pain at 2-3.  Has been taking ibuprofen which seems to help some.  Did not seek medical evaluation at the time of injury.  Continues to complain of a nonproductive cough that feels as if something is stuck in her throat.  It seems to have gotten worse since she started taking ibuprofen regularly.   Objective:    Vitals:   07/03/22 1005  BP: 113/75  Pulse: 66  Resp: 20  Height: 5' 3"$  (1.6 m)  Weight: 155 lb 1.9 oz (70.4 kg)  SpO2: 100%  BMI (Calculated): 27.49    Physical Exam   No results found for this or any previous visit (from the past 24 hour(s)).     The 10-year ASCVD risk score (Arnett DK, et al., 2019) is: 0.4%   Values used to calculate the score:     Age: 59 years     Sex: Female     Is Non-Hispanic African American: No     Diabetic: No     Tobacco smoker: No     Systolic Blood Pressure: 123456 mmHg     Is BP treated: No     HDL Cholesterol: 71 mg/dL     Total Cholesterol: 234 mg/dL   Assessment & Plan:   No problem-specific Assessment & Plan notes found for this encounter.   No follow-ups on file.  ___________________________________________ Clearnce Sorrel,  DNP, APRN, FNP-BC [facility] [place]

## 2022-07-04 ENCOUNTER — Encounter: Payer: Self-pay | Admitting: Medical-Surgical

## 2022-07-04 NOTE — Telephone Encounter (Signed)
Printed and placed in [employer] [facility]

## 2022-07-07 ENCOUNTER — Ambulatory Visit (INDEPENDENT_AMBULATORY_CARE_PROVIDER_SITE_OTHER): Payer: No Typology Code available for payment source | Admitting: Sports Medicine

## 2022-07-07 DIAGNOSIS — S82444A Nondisplaced spiral fracture of shaft of right fibula, initial encounter for closed fracture: Secondary | ICD-10-CM

## 2022-07-07 NOTE — Assessment & Plan Note (Signed)
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 She was placed in a boot and referred to me. On exam she does have significant swelling, bruising, tenderness over the fibula. I did review her x-rays, there is no evidence of disruption of the mortise so I think we can continue to treat this nonoperatively. I have advised strict nonweightbearing for at least a week at which point I would like to see her back, if swelling is improved we will do a short leg walker cast. If I do see bony callus then she can start partial weightbearing, if no bony callus we will wait until another week to rex-ray and then potentially start her on partial weightbearing. We modified her FMLA paperwork today. I will write her a note for work. I expect her to be nonweightbearing until March 11. Return to see me in 1 week for consideration of cast placement.

## 2022-07-07 NOTE — Progress Notes (Signed)
    Procedures performed today:    None.  Independent interpretation of notes and tests performed by another provider:   None.  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 She was placed in a boot and referred to me. On exam she does have significant swelling, bruising, tenderness over the fibula. I did review her x-rays, there is no evidence of disruption of the mortise so I think we can continue to treat this nonoperatively. I have advised strict nonweightbearing for at least a week at which point I would like to see her back, if swelling is improved we will do a short leg walker cast. If I do see bony callus then she can start partial weightbearing, if no bony callus we will wait until another week to rex-ray and then potentially start her on partial weightbearing. We modified her FMLA paperwork today. I will write her a note for work. I expect her to be nonweightbearing until March 11. Return to see me in 1 week for consideration of cast placement.  I spent 30 minutes of total time managing this patient today, this includes chart review, face to face, and non-face to face time.  ____________________________________________ Gwen Her. Dianah Field, M.D., ABFM., CAQSM., AME. Primary Care and Sports Medicine [facility]  Adjunct Professor of [place] of [facility] of Medicine  Risk manager

## 2022-07-14 ENCOUNTER — Encounter: Payer: Self-pay | Admitting: Sports Medicine

## 2022-07-14 ENCOUNTER — Ambulatory Visit (INDEPENDENT_AMBULATORY_CARE_PROVIDER_SITE_OTHER): Payer: No Typology Code available for payment source

## 2022-07-14 ENCOUNTER — Ambulatory Visit (INDEPENDENT_AMBULATORY_CARE_PROVIDER_SITE_OTHER): Payer: No Typology Code available for payment source | Admitting: Sports Medicine

## 2022-07-14 DIAGNOSIS — S82444D Nondisplaced spiral fracture of shaft of right fibula, subsequent encounter for closed fracture with routine healing: Secondary | ICD-10-CM | POA: Diagnosis not present

## 2022-07-14 DIAGNOSIS — S82444A Nondisplaced spiral fracture of shaft of right fibula, initial encounter for closed fracture: Secondary | ICD-10-CM | POA: Diagnosis not present

## 2022-07-14 NOTE — Progress Notes (Signed)
    Procedures performed today:    None.  Independent interpretation of notes and tests performed by another provider:   None.  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 I saw her last week, she continued with the boot, she had significant swelling and pain. Today she is doing a lot better, swelling still present but far less, bruising is improving, she still has tenderness over the fracture. I did personally view her x-rays today, there is no bony callus per We did allow her to put some weight, she had no pain with partial or full weightbearing, we will have her do partial weightbearing with both crutches for the next week and a half, I will see her back the Friday before her return to work date.  X-rays before visit.    ____________________________________________ Gwen Her. Dianah Field, M.D., ABFM., CAQSM., AME. Primary Care and Sports Medicine [facility]  Adjunct Professor of [place] of [facility] of Medicine  Risk manager

## 2022-07-14 NOTE — Assessment & Plan Note (Signed)
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 I saw her last week, she continued with the boot, she had significant swelling and pain. Today she is doing a lot better, swelling still present but far less, bruising is improving, she still has tenderness over the fracture. I did personally view her x-rays today, there is no bony callus per We did allow her to put some weight, she had no pain with partial or full weightbearing, we will have her do partial weightbearing with both crutches for the next week and a half, I will see her back the Friday before her return to work date.  X-rays before visit.

## 2022-07-16 ENCOUNTER — Encounter: Payer: Self-pay | Admitting: Sports Medicine

## 2022-07-21 ENCOUNTER — Ambulatory Visit (INDEPENDENT_AMBULATORY_CARE_PROVIDER_SITE_OTHER): Payer: No Typology Code available for payment source | Admitting: Sports Medicine

## 2022-07-21 ENCOUNTER — Ambulatory Visit: Payer: No Typology Code available for payment source

## 2022-07-21 DIAGNOSIS — S82444D Nondisplaced spiral fracture of shaft of right fibula, subsequent encounter for closed fracture with routine healing: Secondary | ICD-10-CM | POA: Diagnosis not present

## 2022-07-21 NOTE — Assessment & Plan Note (Signed)
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 She continues in the boot, she is not quite ready to return to work, still has significant pain and instability with weightbearing and does not quite trust her ankle. 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 I did review her updated x-rays today, the fracture line is still visible, no bony callus visible, no displacement. I think it is reasonable to keep her out of work for another 2 to 3 weeks, I like to see her back at the end of March for further discussion of return to work. I would like x-rays at the follow-up visit and if we do see bony callus we can get her out of the boot and advance her into a lace up ankle brace.

## 2022-07-21 NOTE — Progress Notes (Signed)
    Procedures performed today:    None.  Independent interpretation of notes and tests performed by another provider:   None.  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 She continues in the boot, she is not quite ready to return to work, still has significant pain and instability with weightbearing and does not quite trust her ankle. 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 I did review her updated x-rays today, the fracture line is still visible, no bony callus visible, no displacement. I think it is reasonable to keep her out of work for another 2 to 3 weeks, I like to see her back at the end of March for further discussion of return to work. I would like x-rays at the follow-up visit and if we do see bony callus we can get her out of the boot and advance her into a lace up ankle brace.    ____________________________________________ Gwen Her. Dianah Field, M.D., ABFM., CAQSM., AME. Primary Care and Sports Medicine [facility]  Adjunct Professor of [place] of [facility] of Medicine  Risk manager

## 2022-08-09 ENCOUNTER — Ambulatory Visit: Payer: No Typology Code available for payment source

## 2022-08-09 ENCOUNTER — Ambulatory Visit (INDEPENDENT_AMBULATORY_CARE_PROVIDER_SITE_OTHER): Payer: No Typology Code available for payment source | Admitting: Sports Medicine

## 2022-08-09 DIAGNOSIS — S82444D Nondisplaced spiral fracture of shaft of right fibula, subsequent encounter for closed fracture with routine healing: Secondary | ICD-10-CM

## 2022-08-09 NOTE — Assessment & Plan Note (Signed)
6 weeks post fracture, good bony callus noted, still with some tenderness over the fracture, return to work date is set for this coming Monday, she can continue her cam boot and start home physical therapy. I would like her to wear the cam boot for at least 2 weeks taking her to 8 weeks from the fracture at which point she can transition into a lace up ASO for the next month after that. I would like to see her back in maybe a month.

## 2022-08-09 NOTE — Progress Notes (Signed)
    Procedures performed today:    X-rays personally reviewed, nondisplaced nonangulated spiral fracture noted, fracture line still visible with bony callus starting to form.  Independent interpretation of notes and tests performed by another provider:   None.  Brief History, Exam, Impression, and Recommendations:    Closed nondisplaced spiral fracture of shaft of right fibula 6 weeks post fracture, good bony callus noted, still with some tenderness over the fracture, return to work date is set for this coming Monday, she can continue her cam boot and start home physical therapy. I would like her to wear the cam boot for at least 2 weeks taking her to 8 weeks from the fracture at which point she can transition into a lace up ASO for the next month after that. I would like to see her back in maybe a month.    ____________________________________________ Gwen Her. Dianah Field, M.D., ABFM., CAQSM., AME. Primary Care and Sports Medicine [facility]  Adjunct Professor of [place] of [facility] of Medicine  Risk manager

## 2022-09-08 ENCOUNTER — Ambulatory Visit (INDEPENDENT_AMBULATORY_CARE_PROVIDER_SITE_OTHER): Payer: No Typology Code available for payment source | Admitting: Sports Medicine

## 2022-09-08 DIAGNOSIS — S82444D Nondisplaced spiral fracture of shaft of right fibula, subsequent encounter for closed fracture with routine healing: Secondary | ICD-10-CM

## 2022-09-08 NOTE — Assessment & Plan Note (Signed)
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

## 2022-09-08 NOTE — Progress Notes (Signed)
    Procedures performed today:    None.  Independent interpretation of notes and tests performed by another provider:   None.  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    ____________________________________________ Ihor Austin. Benjamin Stain, M.D., ABFM., CAQSM., AME. Primary Care and Sports Medicine [facility]  Adjunct Professor of Family Medicine  [place] of [facility] of Medicine  Restaurant manager, fast food

## 2022-11-22 ENCOUNTER — Telehealth: Payer: Self-pay | Admitting: Medical-Surgical

## 2022-11-22 NOTE — Telephone Encounter (Signed)
Spouse called.  His wife, Sabrina Wenn and her parents are your patients. He wants to know if his parents can also become your patients. Mrs. Cibrian has spoken highly of you.

## 2023-01-12 ENCOUNTER — Ambulatory Visit (INDEPENDENT_AMBULATORY_CARE_PROVIDER_SITE_OTHER): Payer: No Typology Code available for payment source | Admitting: Medical-Surgical

## 2023-01-12 ENCOUNTER — Other Ambulatory Visit (HOSPITAL_COMMUNITY)
Admission: RE | Admit: 2023-01-12 | Discharge: 2023-01-12 | Disposition: A | Discharge status: Home / Self Care | Payer: No Typology Code available for payment source | Source: Ambulatory Visit | Attending: Medical-Surgical | Admitting: Medical-Surgical

## 2023-01-12 VITALS — BP 116/64 | HR 74 | Resp 20 | Ht 63.0 in | Wt 155.0 lb

## 2023-01-12 DIAGNOSIS — Z1231 Encounter for screening mammogram for malignant neoplasm of breast: Secondary | ICD-10-CM | POA: Diagnosis not present

## 2023-01-12 DIAGNOSIS — Z124 Encounter for screening for malignant neoplasm of cervix: Secondary | ICD-10-CM | POA: Diagnosis not present

## 2023-01-12 DIAGNOSIS — Z23 Encounter for immunization: Secondary | ICD-10-CM | POA: Diagnosis not present

## 2023-01-12 DIAGNOSIS — Z0000 Encounter for general adult medical examination without abnormal findings: Secondary | ICD-10-CM | POA: Diagnosis not present

## 2023-01-12 DIAGNOSIS — Z13220 Encounter for screening for lipoid disorders: Secondary | ICD-10-CM

## 2023-01-12 DIAGNOSIS — Z113 Encounter for screening for infections with a predominantly sexual mode of transmission: Secondary | ICD-10-CM

## 2023-01-12 NOTE — Progress Notes (Signed)
Complete physical exam  Patient: Jennifer Adams   DOB: [birth year]   42 y.o. Female  MRN: [id number]  Subjective:    Chief Complaint  Patient presents with   Annual Exam   Gynecologic Exam   Jennifer Adams is a 42 y.o. female who presents today for a complete physical exam. She reports consuming a general diet.  Walking 10k steps daily.  She generally feels well. She reports sleeping well. She does not have additional problems to discuss today.    Most recent fall risk assessment:    07/03/2022   10:09 AM  Fall Risk   Falls in the past year? 1  Number falls in past yr: 0  Injury with Fall? 1  Risk for fall due to : History of fall(s)  Follow up Falls evaluation completed     Most recent depression screenings:    07/03/2022   10:09 AM 03/04/2021   10:46 AM  PHQ 2/9 Scores  PHQ - 2 Score 0 0    Vision:Within last year, Dental: No current dental problems and Receives regular dental care, and STD: The patient denies history of sexually transmitted disease.    Patient Care Team: Christen Butter, NP as PCP - General (Nurse Practitioner) Pleasant, Kerry Kass, PA as Physician Assistant (Gastroenterology)   Outpatient Medications Prior to Visit  Medication Sig   famotidine (PEPCID) 20 MG tablet Take 1 tablet (20 mg total) by mouth 2 (two) times daily.   Multiple Vitamin (MULTIVITAMIN) capsule Take 1 capsule by mouth daily.   Omega-3 Fatty Acids (FISH OIL OMEGA-3 PO) Take by mouth.   No facility-administered medications prior to visit.    Review of Systems  Constitutional:  Negative for chills, fever, malaise/fatigue and weight loss.  HENT:  Negative for congestion, ear pain, hearing loss, sinus pain and sore throat.   Eyes:  Negative for blurred vision, photophobia and pain.  Respiratory:  Negative for cough, shortness of breath and wheezing.   Cardiovascular:  Negative for chest pain, palpitations and leg swelling.  Gastrointestinal:  Positive for constipation. Negative for  abdominal pain, diarrhea, heartburn, nausea and vomiting.  Genitourinary:  Negative for dysuria, frequency and urgency.  Musculoskeletal:  Negative for falls and neck pain.  Skin:  Negative for itching and rash.       Skin tag  Neurological:  Negative for dizziness, weakness and headaches.  Endo/Heme/Allergies:  Negative for polydipsia. Does not bruise/bleed easily.  Psychiatric/Behavioral:  Negative for depression, substance abuse and suicidal ideas. The patient is not nervous/anxious and does not have insomnia.      Objective:     BP 116/64   Pulse 74   Resp 20   Ht 5\' 3"  (1.6 m)   Wt 155 lb (70.3 kg)   SpO2 100%   BMI 27.46 kg/m    Physical Exam Vitals reviewed. Exam conducted with a chaperone present.  Constitutional:      General: She is not in acute distress.    Appearance: Normal appearance. She is not ill-appearing.  HENT:     Head: Normocephalic and atraumatic.     Right Ear: Tympanic membrane, ear canal and external ear normal. There is no impacted cerumen.     Left Ear: Tympanic membrane, ear canal and external ear normal. There is no impacted cerumen.     Nose: Nose normal. No congestion or rhinorrhea.     Mouth/Throat:     Mouth: Mucous membranes are moist.     Pharynx: No oropharyngeal exudate or  posterior oropharyngeal erythema.  Eyes:     General: No scleral icterus.       Right eye: No discharge.        Left eye: No discharge.     Extraocular Movements: Extraocular movements intact.     Conjunctiva/sclera: Conjunctivae normal.     Pupils: Pupils are equal, round, and reactive to light.  Neck:     Thyroid: No thyromegaly.     Vascular: No carotid bruit or JVD.     Trachea: Trachea normal.  Cardiovascular:     Rate and Rhythm: Normal rate and regular rhythm.     Pulses: Normal pulses.     Heart sounds: Normal heart sounds. No murmur heard.    No friction rub. No gallop.  Pulmonary:     Effort: Pulmonary effort is normal. No respiratory distress.      Breath sounds: Normal breath sounds. No wheezing.  Abdominal:     General: Bowel sounds are normal. There is no distension.     Palpations: Abdomen is soft.     Tenderness: There is no abdominal tenderness. There is no guarding.  Genitourinary:    Exam position: Lithotomy position.     Pubic Area: No rash.      Labia:        Right: Tenderness (small lump under the skin, previously red, swollen, drained yellow fluid, resolving) present. No rash, lesion or injury.        Left: No rash, tenderness, lesion or injury.      Vagina: Normal.     Cervix: Cervical bleeding present.     Uterus: Normal.      Adnexa: Right adnexa normal.     Rectum: Normal.  Musculoskeletal:        General: Normal range of motion.     Cervical back: Normal range of motion and neck supple.  Lymphadenopathy:     Cervical: No cervical adenopathy.  Skin:    General: Skin is warm and dry.  Neurological:     Mental Status: She is alert and oriented to person, place, and time.     Cranial Nerves: No cranial nerve deficit.  Psychiatric:        Mood and Affect: Mood normal.        Behavior: Behavior normal.        Thought Content: Thought content normal.        Judgment: Judgment normal.      No results found for any visits on 01/12/23.     Assessment & Plan:    Routine Health Maintenance and Physical Exam  Immunization History  Administered Date(s) Administered   DTaP 05/25/1987, 06/25/1987, 07/23/1987, 04/23/1989   HIB (PRP-OMP) 02/12/2014, 04/24/2016   Hepatitis A 07/19/1995   Hepatitis B 07/19/1995, 08/19/1995, 03/25/1997   Influenza Inj Mdck Quad Pf 04/24/2016   Influenza,inj,Quad PF,6+ Mos 02/03/2019, 02/27/2020, 03/04/2021   Influenza-Unspecified 02/12/2014, 01/31/2022   MMR 10/18/1982, 03/03/1989   PFIZER Comirnaty(Gray Top)Covid-19 Tri-Sucrose Vaccine 04/06/2020   PFIZER(Purple Top)SARS-COV-2 Vaccination 06/25/2019, 07/13/2019   Td 11/25/2010   Tdap 07/16/2014, 08/22/2016   Varicella  12/07/2009    Health Maintenance  Topic Date Due   PAP SMEAR-Modifier  05/05/2022   INFLUENZA VACCINE  12/14/2022   COVID-19 Vaccine (4 - 2023-24 season) 01/28/2023 (Originally 01/13/2022)   DTaP/Tdap/Td (8 - Td or Tdap) 08/23/2026   Hepatitis C Screening  Completed   HIV Screening  Completed   HPV VACCINES  Aged Out    Discussed health benefits of physical  activity, and encouraged her to engage in regular exercise appropriate for her age and condition.  1. Annual physical exam Checking labs as below.  Up-to-date on preventative care.  Wellness information provided with AVS. - CBC with Differential/Platelet - CMP14+EGFR  2. Cervical cancer screening Pap smear with HPV cotesting completed.  Patient requesting STI testing so added this to the Pap smear sample. - Cytology - PAP  3. Encounter for screening mammogram for malignant neoplasm of breast Mammogram ordered. - MM DIGITAL SCREENING BILATERAL; Future  4. Lipid screening Checking lipids. - Lipid panel  5. Routine screening for STI (sexually transmitted infection) STI testing as below. - RPR - Hepatitis B surface antigen - Hepatitis C antibody - HIV Antibody (routine testing w rflx)  6. Need for influenza vaccination Flu vaccine given in office today. - Flu vaccine trivalent PF, 6mos and older(Flulaval,Afluria,Fluarix,Fluzone)   Return in about 1 year (around 01/12/2024) for annual physical exam.     Christen Butter, NP

## 2023-01-13 LAB — CBC WITH DIFFERENTIAL/PLATELET
Basophils Absolute: 0 10*3/uL (ref 0.0–0.2)
Basos: 1 %
EOS (ABSOLUTE): 0.2 10*3/uL (ref 0.0–0.4)
Eos: 3 %
Hematocrit: 40.7 % (ref 34.0–46.6)
Hemoglobin: 13.1 g/dL (ref 11.1–15.9)
Immature Grans (Abs): 0 10*3/uL (ref 0.0–0.1)
Immature Granulocytes: 0 %
Lymphocytes Absolute: 1.7 10*3/uL (ref 0.7–3.1)
Lymphs: 37 %
MCH: 28.7 pg (ref 26.6–33.0)
MCHC: 32.2 g/dL (ref 31.5–35.7)
MCV: 89 fL (ref 79–97)
Monocytes Absolute: 0.4 10*3/uL (ref 0.1–0.9)
Monocytes: 8 %
Neutrophils Absolute: 2.4 10*3/uL (ref 1.4–7.0)
Neutrophils: 51 %
Platelets: 273 10*3/uL (ref 150–450)
RBC: 4.57 x10E6/uL (ref 3.77–5.28)
RDW: 11.9 % (ref 11.7–15.4)
WBC: 4.7 10*3/uL (ref 3.4–10.8)

## 2023-01-13 LAB — HEPATITIS B SURFACE AG, CONFIRM: HBsAG Confirmation: POSITIVE — AB

## 2023-01-13 LAB — LIPID PANEL
Chol/HDL Ratio: 3.4 ratio (ref 0.0–4.4)
Cholesterol, Total: 219 mg/dL — ABNORMAL HIGH (ref 100–199)
HDL: 64 mg/dL (ref >39)
LDL Chol Calc (NIH): 133 mg/dL — ABNORMAL HIGH (ref 0–99)
Triglycerides: 123 mg/dL (ref 0–149)
VLDL Cholesterol Cal: 22 mg/dL (ref 5–40)

## 2023-01-13 LAB — CMP14+EGFR
ALT: 20 IU/L (ref 0–32)
AST: 22 IU/L (ref 0–40)
Albumin: 4.9 g/dL (ref 3.9–4.9)
Alkaline Phosphatase: 61 IU/L (ref 44–121)
BUN/Creatinine Ratio: 17 (ref 9–23)
BUN: 10 mg/dL (ref 6–24)
Bilirubin Total: 0.6 mg/dL (ref 0.0–1.2)
CO2: 21 mmol/L (ref 20–29)
Calcium: 9.3 mg/dL (ref 8.7–10.2)
Chloride: 102 mmol/L (ref 96–106)
Creatinine, Ser: 0.6 mg/dL (ref 0.57–1.00)
Globulin, Total: 2.8 g/dL (ref 1.5–4.5)
Glucose: 103 mg/dL — ABNORMAL HIGH (ref 70–99)
Potassium: 4.2 mmol/L (ref 3.5–5.2)
Sodium: 140 mmol/L (ref 134–144)
Total Protein: 7.7 g/dL (ref 6.0–8.5)
eGFR: 116 mL/min/{1.73_m2} (ref >59)

## 2023-01-13 LAB — HEPATITIS C ANTIBODY: Hep C Virus Ab: NONREACTIVE

## 2023-01-13 LAB — RPR: RPR Ser Ql: NONREACTIVE

## 2023-01-13 LAB — HIV ANTIBODY (ROUTINE TESTING W REFLEX): HIV Screen 4th Generation wRfx: NONREACTIVE

## 2023-01-13 LAB — HEPATITIS B SURFACE ANTIGEN

## 2023-01-18 LAB — CYTOLOGY - PAP
Adequacy: ABSENT
Chlamydia: NEGATIVE
Comment: NEGATIVE
Comment: NEGATIVE
Comment: NEGATIVE
Comment: NEGATIVE
Comment: NORMAL
Diagnosis: NEGATIVE
HSV1: NEGATIVE
HSV2: NEGATIVE
High risk HPV: NEGATIVE
Neisseria Gonorrhea: NEGATIVE
Trichomonas: NEGATIVE

## 2023-03-14 ENCOUNTER — Ambulatory Visit: Payer: No Typology Code available for payment source

## 2023-03-14 DIAGNOSIS — Z1231 Encounter for screening mammogram for malignant neoplasm of breast: Secondary | ICD-10-CM | POA: Diagnosis not present

## 2024-01-15 ENCOUNTER — Encounter: Payer: Self-pay | Admitting: Sports Medicine

## 2024-02-20 ENCOUNTER — Other Ambulatory Visit: Payer: Self-pay | Admitting: Medical-Surgical

## 2024-02-20 DIAGNOSIS — Z1231 Encounter for screening mammogram for malignant neoplasm of breast: Secondary | ICD-10-CM

## 2024-03-06 NOTE — Progress Notes (Unsigned)
   Complete physical exam  Patient: Jennifer Adams   DOB: 24-Aug-1980   43 y.o. Female  MRN: [id number]  Subjective:    No chief complaint on file.   Jennifer Adams is a 43 y.o. female who presents today for a complete physical exam. She reports consuming a {diet types:17450} diet. {types:19826} She generally feels {DESC; WELL/FAIRLY WELL/POORLY:18703}. She reports sleeping {DESC; WELL/FAIRLY WELL/POORLY:18703}. She {does/does not:200015} have additional problems to discuss today.    Most recent fall risk assessment:    07/03/2022   10:09 AM  Fall Risk   Falls in the past year? 1  Number falls in past yr: 0  Injury with Fall? 1  Risk for fall due to : History of fall(s)  Follow up Falls evaluation completed     Most recent depression screenings:    07/03/2022   10:09 AM 03/04/2021   10:46 AM  PHQ 2/9 Scores  PHQ - 2 Score 0 0    {VISON DENTAL STD PSA (Optional):27386}  {History (Optional):23778}  Patient Care Team: Willo Mini, NP as PCP - General (Nurse Practitioner) Pleasant, Helene Faden, PA as Physician Assistant (Gastroenterology)   Outpatient Medications Prior to Visit  Medication Sig   famotidine  (PEPCID ) 20 MG tablet Take 1 tablet (20 mg total) by mouth 2 (two) times daily.   Multiple Vitamin (MULTIVITAMIN) capsule Take 1 capsule by mouth daily.   Omega-3 Fatty Acids (FISH OIL OMEGA-3 PO) Take by mouth.   No facility-administered medications prior to visit.    ROS        Objective:     There were no vitals taken for this visit. {Vitals History (Optional):23777}  Physical Exam   No results found for any visits on 03/07/24. {Show previous labs (optional):23779}    Assessment & Plan:    Routine Health Maintenance and Physical Exam  Immunization History  Administered Date(s) Administered   DTaP 05/25/1987, 06/25/1987, 07/23/1987, 04/23/1989   HIB (PRP-OMP) 02/12/2014, 04/24/2016   Hepatitis A 07/19/1995   Hepatitis B 07/19/1995, 08/19/1995, 03/25/1997    Influenza Inj Mdck Quad Pf 04/24/2016   Influenza, Seasonal, Injecte, Preservative Fre 01/12/2023   Influenza,inj,Quad PF,6+ Mos 02/03/2019, 02/27/2020, 03/04/2021   Influenza-Unspecified 02/12/2014, 01/31/2022   MMR 10/18/1982, 03/03/1989   PFIZER Comirnaty(Gray Top)Covid-19 Tri-Sucrose Vaccine 04/06/2020   PFIZER(Purple Top)SARS-COV-2 Vaccination 06/25/2019, 07/13/2019   Td 11/25/2010   Tdap 07/16/2014, 08/22/2016   Varicella 12/07/2009    Health Maintenance  Topic Date Due   HPV VACCINES (1 - 3-dose SCDM series) Never done   Influenza Vaccine  12/14/2023   COVID-19 Vaccine (4 - 2025-26 season) 01/14/2024   Mammogram  03/13/2025   DTaP/Tdap/Td (8 - Td or Tdap) 08/23/2026   Cervical Cancer Screening (HPV/Pap Cotest)  01/12/2028   Hepatitis B Vaccines 19-59 Average Risk  Completed   Hepatitis C Screening  Completed   HIV Screening  Completed   Pneumococcal Vaccine  Aged Out   Meningococcal B Vaccine  Aged Out    Discussed health benefits of physical activity, and encouraged her to engage in regular exercise appropriate for her age and condition.  Problem List Items Addressed This Visit   None Visit Diagnoses       Annual physical exam    -  Primary      No follow-ups on file.     Bethany Cumming, NP

## 2024-03-06 NOTE — Progress Notes (Deleted)
   Complete physical exam  Patient: Jennifer Adams   DOB: 24-Aug-1980   43 y.o. Female  MRN: [id number]  Subjective:    No chief complaint on file.   Jennifer Adams is a 43 y.o. female who presents today for a complete physical exam. She reports consuming a {diet types:17450} diet. {types:19826} She generally feels {DESC; WELL/FAIRLY WELL/POORLY:18703}. She reports sleeping {DESC; WELL/FAIRLY WELL/POORLY:18703}. She {does/does not:200015} have additional problems to discuss today.    Most recent fall risk assessment:    07/03/2022   10:09 AM  Fall Risk   Falls in the past year? 1  Number falls in past yr: 0  Injury with Fall? 1  Risk for fall due to : History of fall(s)  Follow up Falls evaluation completed     Most recent depression screenings:    07/03/2022   10:09 AM 03/04/2021   10:46 AM  PHQ 2/9 Scores  PHQ - 2 Score 0 0    {VISON DENTAL STD PSA (Optional):27386}  {History (Optional):23778}  Patient Care Team: Willo Mini, NP as PCP - General (Nurse Practitioner) Pleasant, Helene Faden, PA as Physician Assistant (Gastroenterology)   Outpatient Medications Prior to Visit  Medication Sig   famotidine  (PEPCID ) 20 MG tablet Take 1 tablet (20 mg total) by mouth 2 (two) times daily.   Multiple Vitamin (MULTIVITAMIN) capsule Take 1 capsule by mouth daily.   Omega-3 Fatty Acids (FISH OIL OMEGA-3 PO) Take by mouth.   No facility-administered medications prior to visit.    ROS        Objective:     There were no vitals taken for this visit. {Vitals History (Optional):23777}  Physical Exam   No results found for any visits on 03/07/24. {Show previous labs (optional):23779}    Assessment & Plan:    Routine Health Maintenance and Physical Exam  Immunization History  Administered Date(s) Administered   DTaP 05/25/1987, 06/25/1987, 07/23/1987, 04/23/1989   HIB (PRP-OMP) 02/12/2014, 04/24/2016   Hepatitis A 07/19/1995   Hepatitis B 07/19/1995, 08/19/1995, 03/25/1997    Influenza Inj Mdck Quad Pf 04/24/2016   Influenza, Seasonal, Injecte, Preservative Fre 01/12/2023   Influenza,inj,Quad PF,6+ Mos 02/03/2019, 02/27/2020, 03/04/2021   Influenza-Unspecified 02/12/2014, 01/31/2022   MMR 10/18/1982, 03/03/1989   PFIZER Comirnaty(Gray Top)Covid-19 Tri-Sucrose Vaccine 04/06/2020   PFIZER(Purple Top)SARS-COV-2 Vaccination 06/25/2019, 07/13/2019   Td 11/25/2010   Tdap 07/16/2014, 08/22/2016   Varicella 12/07/2009    Health Maintenance  Topic Date Due   HPV VACCINES (1 - 3-dose SCDM series) Never done   Influenza Vaccine  12/14/2023   COVID-19 Vaccine (4 - 2025-26 season) 01/14/2024   Mammogram  03/13/2025   DTaP/Tdap/Td (8 - Td or Tdap) 08/23/2026   Cervical Cancer Screening (HPV/Pap Cotest)  01/12/2028   Hepatitis B Vaccines 19-59 Average Risk  Completed   Hepatitis C Screening  Completed   HIV Screening  Completed   Pneumococcal Vaccine  Aged Out   Meningococcal B Vaccine  Aged Out    Discussed health benefits of physical activity, and encouraged her to engage in regular exercise appropriate for her age and condition.  Problem List Items Addressed This Visit   None Visit Diagnoses       Annual physical exam    -  Primary      No follow-ups on file.     Bethany Cumming, NP

## 2024-03-07 ENCOUNTER — Encounter: Payer: Self-pay | Admitting: Medical-Surgical

## 2024-03-07 ENCOUNTER — Ambulatory Visit (INDEPENDENT_AMBULATORY_CARE_PROVIDER_SITE_OTHER): Payer: PRIVATE HEALTH INSURANCE | Admitting: Medical-Surgical

## 2024-03-07 VITALS — BP 118/79 | HR 57 | Resp 20 | Ht 63.0 in | Wt 155.0 lb

## 2024-03-07 DIAGNOSIS — Z0000 Encounter for general adult medical examination without abnormal findings: Secondary | ICD-10-CM

## 2024-03-07 DIAGNOSIS — Z13220 Encounter for screening for lipoid disorders: Secondary | ICD-10-CM | POA: Diagnosis not present

## 2024-03-07 DIAGNOSIS — N9489 Other specified conditions associated with female genital organs and menstrual cycle: Secondary | ICD-10-CM

## 2024-03-07 MED ORDER — MEDROXYPROGESTERONE ACETATE 10 MG PO TABS: 10.0000 mg | ORAL_TABLET | Freq: Every day | ORAL | 0 refills | Status: AC

## 2024-03-07 NOTE — Patient Instructions (Signed)
 Preventive Care 42-42 Years Old, Female  Preventive care refers to lifestyle choices and visits with your health care provider that can promote health and wellness. Preventive care visits are also called wellness exams.  What can I expect for my preventive care visit?  Counseling  Your health care provider may ask you questions about your:  Medical history, including:  Past medical problems.  Family medical history.  Pregnancy history.  Current health, including:  Menstrual cycle.  Method of birth control.  Emotional well-being.  Home life and relationship well-being.  Sexual activity and sexual health.  Lifestyle, including:  Alcohol, nicotine or tobacco, and drug use.  Access to firearms.  Diet, exercise, and sleep habits.  Work and work Astron[employer].  Sunscreen use.  Safety issues such as seatbelt and bike helmet use.  Physical exam  Your health care provider will check your:  Height and weight. These may be used to calculate your BMI (body mass index). BMI is a measurement that tells if you are at a healthy weight.  Waist circumference. This measures the distance around your waistline. This measurement also tells if you are at a healthy weight and may help predict your risk of certain diseases, such as type 2 diabetes and high blood pressure.  Heart rate and blood pressure.  Body temperature.  Skin for abnormal spots.  What immunizations do I need?    Vaccines are usually given at various ages, according to a schedule. Your health care provider will recommend vaccines for you based on your age, medical history, and lifestyle or other factors, such as travel or where you work.  What tests do I need?  Screening  Your health care provider may recommend screening tests for certain conditions. This may include:  Lipid and cholesterol levels.  Diabetes screening. This is done by checking your blood sugar (glucose) after you have not eaten for a while (fasting).  Pelvic exam and Pap test.  Hepatitis B test.  Hepatitis C  test.  HIV (human immunodeficiency virus) test.  STI (sexually transmitted infection) testing, if you are at risk.  Lung cancer screening.  Colorectal cancer screening.  Mammogram. Talk with your health care provider about when you should start having regular mammograms. This may depend on whether you have a family history of breast cancer.  BRCA-related cancer screening. This may be done if you have a family history of breast, ovarian, tubal, or peritoneal cancers.  Bone density scan. This is done to screen for osteoporosis.  Talk with your health care provider about your test results, treatment options, and if necessary, the need for more tests.  Follow these instructions at home:  Eating and drinking    Eat a diet that includes fresh fruits and vegetables, whole grains, lean protein, and low-fat dairy products.  Take vitamin and mineral supplements as recommended by your health care provider.  Do not drink alcohol if:  Your health care provider tells you not to drink.  You are pregnant, may be pregnant, or are planning to become pregnant.  If you drink alcohol:  Limit how much you have to 0-1 drink a day.  Know how much alcohol is in your drink. In the [place] one drink equals one 12 oz bottle of beer (355 mL), one 5 oz glass of wine (148 mL), or one 1 oz glass of hard liquor (44 mL).  Lifestyle  Brush your teeth every morning and night with fluoride toothpaste. Floss one time each day.  Exercise for at least  30 minutes 5 or more days each week.  Do not use any products that contain nicotine or tobacco. These products include cigarettes, chewing tobacco, and vaping devices, such as e-cigarettes. If you need help quitting, ask your health care provider.  Do not use drugs.  If you are sexually active, practice safe sex. Use a condom or other form of protection to prevent STIs.  If you do not wish to become pregnant, use a form of birth control. If you plan to become pregnant, see your health care provider for a  prepregnancy visit.  Take aspirin only as told by your health care provider. Make sure that you understand how much to take and what form to take. Work with your health care provider to find out whether it is safe and beneficial for you to take aspirin daily.  Find healthy ways to manage stress, such as:  Meditation, yoga, or listening to music.  Journaling.  Talking to a trusted person.  Spending time with friends and family.  Minimize exposure to UV radiation to reduce your risk of skin cancer.  Safety  Always wear your seat belt while driving or riding in a vehicle.  Do not drive:  If you have been drinking alcohol. Do not ride with someone who has been drinking.  When you are tired or distracted.  While texting.  If you have been using any mind-altering substances or drugs.  Wear a helmet and other protective equipment during sports activities.  If you have firearms in your house, make sure you follow all gun safety procedures.  Seek help if you have been physically or sexually abused.  What's next?  Visit your health care provider once a year for an annual wellness visit.  Ask your health care provider how often you should have your eyes and teeth checked.  Stay up to date on all vaccines.  This information is not intended to replace advice given to you by your health care provider. Make sure you discuss any questions you have with your health care provider.  Document Revised: 10/27/2020 Document Reviewed: 10/27/2020  [employer]vier Patient Education  2024 [employer].

## 2024-03-08 ENCOUNTER — Ambulatory Visit: Payer: Self-pay | Admitting: Medical-Surgical

## 2024-03-08 LAB — CMP14+EGFR
ALT: 18 IU/L (ref 0–32)
AST: 20 IU/L (ref 0–40)
Albumin: 4.5 g/dL (ref 3.9–4.9)
Alkaline Phosphatase: 56 IU/L (ref 41–116)
BUN/Creatinine Ratio: 12 (ref 9–23)
BUN: 8 mg/dL (ref 6–24)
Bilirubin Total: 0.8 mg/dL (ref 0.0–1.2)
CO2: 22 mmol/L (ref 20–29)
Calcium: 9.3 mg/dL (ref 8.7–10.2)
Chloride: 104 mmol/L (ref 96–106)
Creatinine, Ser: 0.68 mg/dL (ref 0.57–1.00)
Globulin, Total: 2.9 g/dL (ref 1.5–4.5)
Glucose: 83 mg/dL (ref 70–99)
Potassium: 4.4 mmol/L (ref 3.5–5.2)
Sodium: 139 mmol/L (ref 134–144)
Total Protein: 7.4 g/dL (ref 6.0–8.5)
eGFR: 111 mL/min/1.73 (ref >59)

## 2024-03-08 LAB — LIPID PANEL
Chol/HDL Ratio: 3.3 ratio (ref 0.0–4.4)
Cholesterol, Total: 213 mg/dL — ABNORMAL HIGH (ref 100–199)
HDL: 65 mg/dL (ref >39)
LDL Chol Calc (NIH): 137 mg/dL — ABNORMAL HIGH (ref 0–99)
Triglycerides: 61 mg/dL (ref 0–149)
VLDL Cholesterol Cal: 11 mg/dL (ref 5–40)

## 2024-03-08 LAB — CBC
Hematocrit: 41.6 % (ref 34.0–46.6)
Hemoglobin: 13.4 g/dL (ref 11.1–15.9)
MCH: 29.8 pg (ref 26.6–33.0)
MCHC: 32.2 g/dL (ref 31.5–35.7)
MCV: 93 fL (ref 79–97)
Platelets: 346 x10E3/uL (ref 150–450)
RBC: 4.49 x10E6/uL (ref 3.77–5.28)
RDW: 11.6 % — ABNORMAL LOW (ref 11.7–15.4)
WBC: 4.4 x10E3/uL (ref 3.4–10.8)

## 2024-03-17 ENCOUNTER — Ambulatory Visit (HOSPITAL_BASED_OUTPATIENT_CLINIC_OR_DEPARTMENT_OTHER)
Admission: RE | Admit: 2024-03-17 | Discharge: 2024-03-17 | Disposition: A | Discharge status: Home / Self Care | Payer: PRIVATE HEALTH INSURANCE | Source: Ambulatory Visit | Attending: Medical-Surgical | Admitting: Medical-Surgical

## 2024-03-17 ENCOUNTER — Encounter (HOSPITAL_BASED_OUTPATIENT_CLINIC_OR_DEPARTMENT_OTHER): Payer: Self-pay

## 2024-03-17 DIAGNOSIS — Z1231 Encounter for screening mammogram for malignant neoplasm of breast: Secondary | ICD-10-CM | POA: Diagnosis present

## 2024-03-19 ENCOUNTER — Ambulatory Visit: Payer: Self-pay | Admitting: Medical-Surgical

## 2025-03-09 ENCOUNTER — Encounter: Payer: PRIVATE HEALTH INSURANCE | Admitting: Medical-Surgical
